# Patient Record
Sex: Female | Born: 1975 | Race: Black or African American | Hispanic: No | Marital: Single | Smoking: Current every day smoker
Health system: Southern US, Community
[De-identification: ages and names within clinical notes are randomized; demographics above are authoritative.]

## PROBLEM LIST (undated history)

## (undated) DIAGNOSIS — M1990 Unspecified osteoarthritis, unspecified site: Secondary | ICD-10-CM

## (undated) DIAGNOSIS — D649 Anemia, unspecified: Secondary | ICD-10-CM

## (undated) DIAGNOSIS — I10 Essential (primary) hypertension: Secondary | ICD-10-CM

## (undated) DIAGNOSIS — O009 Unspecified ectopic pregnancy without intrauterine pregnancy: Secondary | ICD-10-CM

## (undated) HISTORY — PX: ECTOPIC PREGNANCY SURGERY: SHX613

## (undated) HISTORY — PX: APPENDECTOMY: SHX54

## (undated) HISTORY — PX: WRIST FRACTURE SURGERY: SHX121

---

## 2019-03-15 ENCOUNTER — Encounter (HOSPITAL_BASED_OUTPATIENT_CLINIC_OR_DEPARTMENT_OTHER): Payer: Self-pay | Admitting: Emergency Medicine

## 2019-03-15 ENCOUNTER — Emergency Department (HOSPITAL_BASED_OUTPATIENT_CLINIC_OR_DEPARTMENT_OTHER)
Admission: EM | Admit: 2019-03-15 | Discharge: 2019-03-15 | Disposition: A | Discharge status: Home / Self Care | Payer: Medicaid Other | Attending: Emergency Medicine | Admitting: Emergency Medicine

## 2019-03-15 ENCOUNTER — Emergency Department (HOSPITAL_BASED_OUTPATIENT_CLINIC_OR_DEPARTMENT_OTHER): Payer: Medicaid Other

## 2019-03-15 ENCOUNTER — Other Ambulatory Visit: Payer: Self-pay

## 2019-03-15 DIAGNOSIS — I10 Essential (primary) hypertension: Secondary | ICD-10-CM | POA: Diagnosis not present

## 2019-03-15 DIAGNOSIS — F17210 Nicotine dependence, cigarettes, uncomplicated: Secondary | ICD-10-CM | POA: Diagnosis not present

## 2019-03-15 DIAGNOSIS — M25562 Pain in left knee: Secondary | ICD-10-CM | POA: Diagnosis not present

## 2019-03-15 DIAGNOSIS — M25561 Pain in right knee: Secondary | ICD-10-CM | POA: Insufficient documentation

## 2019-03-15 DIAGNOSIS — W19XXXA Unspecified fall, initial encounter: Secondary | ICD-10-CM

## 2019-03-15 HISTORY — DX: Unspecified ectopic pregnancy without intrauterine pregnancy: O00.90

## 2019-03-15 HISTORY — DX: Essential (primary) hypertension: I10

## 2019-03-15 MED ORDER — CYCLOBENZAPRINE HCL 10 MG PO TABS: 10.0000 mg | ORAL_TABLET | Freq: Two times a day (BID) | ORAL | 0 refills | Status: AC | PRN

## 2019-03-15 MED ORDER — HYDROCODONE-ACETAMINOPHEN 5-325 MG PO TABS
1.0000 | ORAL_TABLET | Freq: Once | ORAL | Status: AC
  Administered 2019-03-15: 1 via ORAL
  Filled 2019-03-15: qty 1

## 2019-03-15 MED ORDER — LISINOPRIL 10 MG PO TABS
20.0000 mg | ORAL_TABLET | Freq: Once | ORAL | Status: AC
  Administered 2019-03-15: 20 mg via ORAL
  Filled 2019-03-15: qty 2

## 2019-03-15 MED ORDER — NAPROXEN 500 MG PO TABS: 500.0000 mg | ORAL_TABLET | Freq: Two times a day (BID) | ORAL | 0 refills | Status: DC

## 2019-03-15 NOTE — Discharge Instructions (Signed)
Thank you for allowing me to care for you today. Please return to the emergency department if you have new or worsening symptoms. Take your medications as instructed.  ° °

## 2019-03-15 NOTE — ED Provider Notes (Signed)
[facility] EMERGENCY DEPARTMENT Provider Note   CSN: [id number] Arrival date & time: 03/15/19  1246     History Chief Complaint  Patient presents with  . Fall  . Knee Pain  . Hypertension    Monica Blanchard is a 43 y.o. female.  43 year old female with past medical history of hypertension presenting to the emergency department for bilateral knee pain after fall yesterday.  Patient reports that she was on her wooden deck last night when she had a mechanical fall and slipped and landed on her bilateral knees.  Did not hit her head or pass out.  Reports that she has been able to ambulate but with pain mostly in the medial aspect of her bilateral knees.  No previous injury to her knees in the past.  Has been trying ice, over-the-counter analgesics without relief.  Also, she reports that she has not taken any of her blood pressure medication today        Past Medical History:  Diagnosis Date  . Ectopic pregnancy    x 2  . Hypertension     There are no problems to display for this patient.   Past Surgical History:  Procedure Laterality Date  . APPENDECTOMY       OB History   No obstetric history on file.     History reviewed. No pertinent family history.  Social History   Tobacco Use  . Smoking status: Current Every Day Smoker    Packs/day: 0.50    Years: 15.00    Pack years: 7.50    Types: Cigarettes  . Smokeless tobacco: Never Used  Substance Use Topics  . Alcohol use: Yes    Comment: occ  . Drug use: Not on file    Home Medications Prior to Admission medications   Medication Sig Start Date End Date Taking? Authorizing Provider  cyclobenzaprine (FLEXERIL) 10 MG tablet Take 1 tablet (10 mg total) by mouth 2 (two) times daily as needed for up to 7 days for muscle spasms. 03/15/19 03/22/19  Ronnie Doss A, PA-C  naproxen (NAPROSYN) 500 MG tablet Take 1 tablet (500 mg total) by mouth 2 (two) times daily. 03/15/19   Arlyn Dunning, PA-C     Allergies    Patient has no allergy information on record.  Review of Systems   Review of Systems  Constitutional: Negative for chills and fever.  Respiratory: Negative for shortness of breath.   Cardiovascular: Negative for chest pain.  Musculoskeletal: Positive for arthralgias, gait problem and joint swelling. Negative for back pain, myalgias, neck pain and neck stiffness.  Skin: Negative for color change, rash and wound.  Neurological: Negative for dizziness, weakness, light-headedness and numbness.  Hematological: Does not bruise/bleed easily.  All other systems reviewed and are negative.   Physical Exam Updated Vital Signs BP (!) 194/107   Pulse 70   Temp 98.7 F (37.1 C) (Oral)   Resp 17   SpO2 100%   Physical Exam Vitals and nursing note reviewed.  Constitutional:      Appearance: Normal appearance.  HENT:     Head: Normocephalic.  Eyes:     Conjunctiva/sclera: Conjunctivae normal.  Cardiovascular:     Rate and Rhythm: Normal rate and regular rhythm.  Pulmonary:     Effort: Pulmonary effort is normal.  Musculoskeletal:     Right knee: Swelling present. No deformity, effusion, erythema, ecchymosis, lacerations, bony tenderness or crepitus. Normal range of motion. Tenderness present over the medial joint line. No patellar  tendon tenderness. Normal alignment.     Left knee: Swelling present. No deformity, effusion, erythema, ecchymosis, lacerations, bony tenderness or crepitus. Decreased range of motion. Tenderness present over the medial joint line. No patellar tendon tenderness. Normal alignment.     Right foot: Normal pulse.     Left foot: Normal pulse.     Comments: Bilateral medial knee swelling and tenderness,. Unable to test stability due to pain and swelling  Skin:    General: Skin is dry.  Neurological:     Mental Status: She is alert.  Psychiatric:        Mood and Affect: Mood normal.     ED Results / Procedures / Treatments   Labs (all labs  ordered are listed, but only abnormal results are displayed) Labs Reviewed - No data to display  EKG None  Radiology No results found.  Procedures Procedures (including critical care time)  Medications Ordered in ED Medications  lisinopril (ZESTRIL) tablet 20 mg (20 mg Oral Given 03/15/19 1441)  HYDROcodone-acetaminophen (NORCO/VICODIN) 5-325 MG per tablet 1 tablet (1 tablet Oral Given 03/15/19 1441)    ED Course  I have reviewed the triage vital signs and the nursing notes.  Pertinent labs & imaging results that were available during my care of the patient were reviewed by me and considered in my medical decision making (see chart for details).    MDM Rules/Calculators/A&P                      Based on review of vitals, medical screening exam, lab work and/or imaging, there does not appear to be an acute, emergent etiology for the patient's symptoms. Counseled pt on good return precautions and encouraged both PCP and ED follow-up as needed.  Prior to discharge, I also discussed incidental imaging findings with patient in detail and advised appropriate, recommended follow-up in detail.  Clinical Impression: 1. Fall, initial encounter   2. Acute pain of both knees     Disposition: Discharge  Prior to providing a prescription for a controlled substance, I independently reviewed the patient's recent prescription history on the [place]. The patient had no recent or regular prescriptions and was deemed appropriate for a brief, less than 3 day prescription of narcotic for acute analgesia.  This note was prepared with assistance of Systems analyst. Occasional wrong-word or sound-a-like substitutions may have occurred due to the inherent limitations of voice recognition software.  Final Clinical Impression(s) / ED Diagnoses Final diagnoses:  Fall, initial encounter  Acute pain of both knees    Rx / DC Orders ED  Discharge Orders         Ordered    cyclobenzaprine (FLEXERIL) 10 MG tablet  2 times daily PRN     03/15/19 1551    naproxen (NAPROSYN) 500 MG tablet  2 times daily     03/15/19 1551           Kristine Royal 03/18/19 2319    Margette Fast, MD 03/20/19 [phone]

## 2019-03-15 NOTE — ED Triage Notes (Signed)
Pt here after falling on deck yesterday. Bilateral knee pain. Pt says she hasn't taken blood pressure in 2 days and ate high sodium before coming in an feels like BP high.

## 2019-06-29 ENCOUNTER — Encounter (HOSPITAL_BASED_OUTPATIENT_CLINIC_OR_DEPARTMENT_OTHER): Payer: Self-pay | Admitting: *Deleted

## 2019-06-29 ENCOUNTER — Emergency Department (HOSPITAL_BASED_OUTPATIENT_CLINIC_OR_DEPARTMENT_OTHER)
Admission: EM | Admit: 2019-06-29 | Discharge: 2019-06-29 | Disposition: A | Discharge status: Home / Self Care | Payer: Medicaid Other | Attending: Emergency Medicine | Admitting: Emergency Medicine

## 2019-06-29 ENCOUNTER — Other Ambulatory Visit: Payer: Self-pay

## 2019-06-29 DIAGNOSIS — F17210 Nicotine dependence, cigarettes, uncomplicated: Secondary | ICD-10-CM | POA: Insufficient documentation

## 2019-06-29 DIAGNOSIS — I10 Essential (primary) hypertension: Secondary | ICD-10-CM | POA: Insufficient documentation

## 2019-06-29 DIAGNOSIS — M25552 Pain in left hip: Secondary | ICD-10-CM | POA: Diagnosis present

## 2019-06-29 MED ORDER — HYDROCHLOROTHIAZIDE 25 MG PO TABS
25.0000 mg | ORAL_TABLET | Freq: Once | ORAL | Status: AC
  Administered 2019-06-29: 16:00:00 25 mg via ORAL
  Filled 2019-06-29: qty 1

## 2019-06-29 MED ORDER — LISINOPRIL 10 MG PO TABS
10.0000 mg | ORAL_TABLET | Freq: Once | ORAL | Status: AC
  Administered 2019-06-29: 10 mg via ORAL
  Filled 2019-06-29: qty 1

## 2019-06-29 NOTE — Discharge Instructions (Signed)
Your blood pressure is high today.  Please take your blood pressure medications as prescribed.  Call and follow up closely with your doctor for recheck.  Take ibuprofen or tylenol as needed for recurrent hip pain.

## 2019-06-29 NOTE — ED Provider Notes (Signed)
[facility] EMERGENCY DEPARTMENT Provider Note   CSN: [id number] Arrival date & time: 06/29/19  1527     History No chief complaint on file.   Monica Blanchard is a 44 y.o. female.  The history is provided by the patient. No language interpreter was used.  Hip Pain     44 year old female with history of hypertension presenting complaining of high blood pressure.  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  She missed her Covid testing for the week and decided to go to an urgent care center to have her Covid test.  During her initial exam patient was found to have elevated blood pressure and was recommended to go to the ER for evaluation.  Patient states she takes 3 different blood pressures medications however she admits she has been "slacking off" and having been taking her blood pressure medication for approximately 4 days.  She still has it available but just did not take it.  She asked could her recurrent left hip pain be a source of her high blood pressure.  She states she has had recurrent pain to her left hip radiates towards her left thigh for nearly 2 months.  Pain is described as an sharp achy sensation, worse with movement which she attributed to arthritis pain.  She has been taking anti-inflammatory medication with improvement.  She rates the pain currently as 6 out of 10.  She does not complain of any fever chills no headache, vision changes, chest pain, trouble breathing, focal numbness or focal weakness.  She denies any bowel bladder incontinence or saddle anesthesia.  She denies any recent injury of her back.  She admits that she is dealing with a lot of stress at home and have not been taking care of herself appropriately.  She does not have any Covid symptoms.  Past Medical History:  Diagnosis Date  . Ectopic pregnancy    x 2  . Hypertension     There are no problems to display for this patient.   Past Surgical  History:  Procedure Laterality Date  . APPENDECTOMY       OB History   No obstetric history on file.     No family history on file.  Social History   Tobacco Use  . Smoking status: Current Every Day Smoker    Packs/day: 0.50    Years: 15.00    Pack years: 7.50    Types: Cigarettes  . Smokeless tobacco: Never Used  Substance Use Topics  . Alcohol use: Yes    Comment: occ  . Drug use: Not on file    Home Medications Prior to Admission medications   Medication Sig Start Date End Date Taking? Authorizing Provider  naproxen (NAPROSYN) 500 MG tablet Take 1 tablet (500 mg total) by mouth 2 (two) times daily. 03/15/19   Arlyn Dunning, PA-C    Allergies    Patient has no allergy information on record.  Review of Systems   Review of Systems  All other systems reviewed and are negative.   Physical Exam Updated Vital Signs There were no vitals taken for this visit.  Physical Exam Vitals and nursing note reviewed.  Constitutional:      General: She is not in acute distress.    Appearance: She is well-developed. She is obese.     Comments: Patient resting comfortably in bed in no acute discomfort.  HENT:     Head: Atraumatic.  Eyes:     Conjunctiva/sclera: Conjunctivae normal.  Cardiovascular:     Rate and Rhythm: Normal rate and regular rhythm.     Pulses: Normal pulses.     Heart sounds: Normal heart sounds.  Pulmonary:     Effort: Pulmonary effort is normal.     Breath sounds: Normal breath sounds.  Musculoskeletal:        General: Tenderness (Left hip: Tenderness to left inguinal region without overlying skin changes.  Normal hip flexion extension abduction abduction.  No significant lumbar spine tenderness.) present.     Cervical back: Neck supple.  Skin:    Capillary Refill: Capillary refill takes less than 2 seconds.     Findings: No rash.  Neurological:     Mental Status: She is alert.     ED Results / Procedures / Treatments   Labs (all labs  ordered are listed, but only abnormal results are displayed) Labs Reviewed - No data to display  EKG None  Radiology No results found.  Procedures Procedures (including critical care time)  Medications Ordered in ED Medications  hydrochlorothiazide (HYDRODIURIL) tablet 25 mg (25 mg Oral Given 06/29/19 1601)  lisinopril (ZESTRIL) tablet 10 mg (10 mg Oral Given 06/29/19 1601)    ED Course  I have reviewed the triage vital signs and the nursing notes.  Pertinent labs & imaging results that were available during my care of the patient were reviewed by me and considered in my medical decision making (see chart for details).    MDM Rules/Calculators/A&P                      BP (!) 196/123 (BP Location: Right Arm)   Pulse 65   Temp 98.2 F (36.8 C) (Oral)   Resp 16   Ht 5\' 9"  (1.753 m)   Wt 106.6 kg   SpO2 100%   BMI 34.70 kg/m   Final Clinical Impression(s) / ED Diagnoses Final diagnoses:  Asymptomatic hypertension  Left hip pain    Rx / DC Orders ED Discharge Orders    None     3:58 PM Patient here with initial concern of high blood pressure when it was incidentally noted during an visit to urgent care as patient requesting for a Covid test.  She does have elevated blood pressure in the 448J systolic in both arms.  However this is asymptomatic hypertension as patient does not presents with any concerning feature to suggest hypertensive urgency or emergency.  She has not taken her typical 3 blood pressure medications which is likely contributing to her elevated blood pressure.  She has tenderness to her left hip without any significant injury.  I did offer x-ray however it is likely low yield as we both agree the likelihood of fracture or dislocation is very low.  I will provide patient with home blood pressure medications.  I have low suspicion for dissection causing hip pain as her hip pain has been chronic for the past 2 months.  No focal neuro deficit on exam.  4:48  PM BP iimproves with BP medications.  Encourage pt to take her meds as previously prescribed. outpt f/u with PCP encourage.  Return precaution given.    Domenic Moras, PA-C 06/29/19 1650    Lucrezia Starch, MD 07/04/19 [phone]

## 2019-06-29 NOTE — ED Triage Notes (Signed)
Pt states she has a hx of HTN, today presents with left hip, pain site starts a left groin area and radiates down left leg, states when attempting to lift left leg

## 2019-08-25 ENCOUNTER — Emergency Department (HOSPITAL_BASED_OUTPATIENT_CLINIC_OR_DEPARTMENT_OTHER)
Admission: EM | Admit: 2019-08-25 | Discharge: 2019-08-25 | Disposition: A | Discharge status: Home / Self Care | Payer: Medicaid Other | Attending: Emergency Medicine | Admitting: Emergency Medicine

## 2019-08-25 ENCOUNTER — Emergency Department (HOSPITAL_BASED_OUTPATIENT_CLINIC_OR_DEPARTMENT_OTHER): Payer: Medicaid Other

## 2019-08-25 ENCOUNTER — Other Ambulatory Visit: Payer: Self-pay

## 2019-08-25 ENCOUNTER — Encounter (HOSPITAL_BASED_OUTPATIENT_CLINIC_OR_DEPARTMENT_OTHER): Payer: Self-pay | Admitting: *Deleted

## 2019-08-25 DIAGNOSIS — M87052 Idiopathic aseptic necrosis of left femur: Secondary | ICD-10-CM | POA: Diagnosis not present

## 2019-08-25 DIAGNOSIS — I10 Essential (primary) hypertension: Secondary | ICD-10-CM | POA: Diagnosis not present

## 2019-08-25 DIAGNOSIS — F17210 Nicotine dependence, cigarettes, uncomplicated: Secondary | ICD-10-CM | POA: Diagnosis not present

## 2019-08-25 DIAGNOSIS — M25552 Pain in left hip: Secondary | ICD-10-CM | POA: Diagnosis present

## 2019-08-25 DIAGNOSIS — Z79899 Other long term (current) drug therapy: Secondary | ICD-10-CM | POA: Diagnosis not present

## 2019-08-25 MED ORDER — KETOROLAC TROMETHAMINE 60 MG/2ML IM SOLN
60.0000 mg | Freq: Once | INTRAMUSCULAR | Status: AC
  Administered 2019-08-25: 60 mg via INTRAMUSCULAR
  Filled 2019-08-25: qty 2

## 2019-08-25 NOTE — ED Notes (Signed)
Provider aware of elevated bp.  Patient reports that she will take her meds when she get home.  Medicated for pain.  Left at this time.

## 2019-08-25 NOTE — ED Provider Notes (Signed)
[facility] EMERGENCY DEPARTMENT Provider Note   CSN: [id number] Arrival date & time: 08/25/19  1119     History Chief Complaint  Patient presents with  . Hip Pain    Monica Blanchard is a 44 y.o. female.  The history is provided by the patient and medical records. No language interpreter was used.  Hip Pain   Monica Blanchard is a 44 y.o. female who presents to the Emergency Department complaining of hip pain. She presents the emergency department complaining of atraumatic left hip pain for the last few months. Pain is located in the left lateral hip and in her groin. She describes it as a bone type pain. It is constant in nature but worse when she sleeps and when she moves. Over the last few weeks it has been significantly worse and now is waking her at night. She has been taking ibuprofen at home with initial improvement in her pain but now her pain persist despite ibuprofen use. No recent injuries. She denies any associated fevers, shortness of breath, abdominal pain, nausea, vomiting, numbness, weakness. She has a history of hypertension, no additional medical problems.    Past Medical History:  Diagnosis Date  . Ectopic pregnancy    x 2  . Hypertension     There are no problems to display for this patient.   Past Surgical History:  Procedure Laterality Date  . APPENDECTOMY       OB History   No obstetric history on file.     No family history on file.  Social History   Tobacco Use  . Smoking status: Current Every Day Smoker    Packs/day: 0.50    Years: 15.00    Pack years: 7.50    Types: Cigarettes  . Smokeless tobacco: Never Used  Substance Use Topics  . Alcohol use: Yes    Comment: occ  . Drug use: Not on file    Home Medications Prior to Admission medications   Medication Sig Start Date End Date Taking? Authorizing Provider  benazepril-hydrochlorthiazide (LOTENSIN HCT) 20-25 MG tablet Take 1 tablet by mouth daily.   Yes [provider]  lisinopril (ZESTRIL) 20 MG tablet Take 20 mg by mouth daily.   Yes [provider]  naproxen (NAPROSYN) 500 MG tablet Take 1 tablet (500 mg total) by mouth 2 (two) times daily. 03/15/19   Arlyn Dunning, PA-C    Allergies    Patient has no known allergies.  Review of Systems   Review of Systems  All other systems reviewed and are negative.   Physical Exam Updated Vital Signs BP (!) 150/107   Pulse 73   Temp 98.3 F (36.8 C) (Oral)   Resp 16   Ht 5\' 9"  (1.753 m)   Wt 106.6 kg   SpO2 98%   BMI 34.71 kg/m   Physical Exam Vitals and nursing note reviewed.  Constitutional:      Appearance: She is well-developed.  HENT:     Head: Normocephalic and atraumatic.  Cardiovascular:     Rate and Rhythm: Normal rate and regular rhythm.  Pulmonary:     Effort: Pulmonary effort is normal. No respiratory distress.  Abdominal:     Tenderness: There is no rebound.  Musculoskeletal:     Comments: 2+ DP pulses bilaterally. There is no tenderness to palpation over the hip on examination. There is pain with range of motion of the hip. Five out of five strength to bilateral lower extremities.  Skin:  General: Skin is warm and dry.  Neurological:     Mental Status: She is alert and oriented to person, place, and time.  Psychiatric:        Behavior: Behavior normal.     ED Results / Procedures / Treatments   Labs (all labs ordered are listed, but only abnormal results are displayed) Labs Reviewed - No data to display  EKG None  Radiology DG Hip Unilat W or Wo Pelvis 2-3 Views Left  Result Date: 08/25/2019 CLINICAL DATA:  Left hip pain for several months without known injury. EXAM: DG HIP (WITH OR WITHOUT PELVIS) 2-3V LEFT COMPARISON:  None. FINDINGS: There is no evidence of hip fracture or dislocation. However, there is a mixture of lucency and sclerosis involving the left femoral head concerning for avascular necrosis. 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 Electronically Signed   By: Marijo Conception M.D.   On: 08/25/2019 12:23    Procedures Procedures (including critical care time)  Medications Ordered in ED Medications - No data to display  ED Course  I have reviewed the triage vital signs and the nursing notes.  Pertinent labs & imaging results that were available during my care of the patient were reviewed by me and considered in my medical decision making (see chart for details).    MDM Rules/Calculators/A&P                     Patient here for evaluation of atraumatic left hip pain. She is neurologically and vascular early intact on evaluation. Imaging is concerning for a vascular necrosis of the left hip. Discussed with patient findings of studies recommendation for orthopedics follow-up. Recommend continuing home Tylenol and ibuprofen according to label instructions.  Presentation is not consistent with septic or gouty arthritis, dissection.  Final Clinical Impression(s) / ED Diagnoses Final diagnoses:  Avascular necrosis of bone of left hip [facility])    Rx / [place] Orders ED Discharge Orders    None       Quintella Reichert, MD 08/25/19 1338

## 2019-08-25 NOTE — ED Triage Notes (Signed)
Left hip pain for a few months. She has been taking OTC meds with no improvement.

## 2019-08-29 ENCOUNTER — Encounter (HOSPITAL_BASED_OUTPATIENT_CLINIC_OR_DEPARTMENT_OTHER): Payer: Self-pay | Admitting: *Deleted

## 2019-08-29 ENCOUNTER — Other Ambulatory Visit: Payer: Self-pay

## 2019-08-29 ENCOUNTER — Emergency Department (HOSPITAL_BASED_OUTPATIENT_CLINIC_OR_DEPARTMENT_OTHER)
Admission: EM | Admit: 2019-08-29 | Discharge: 2019-08-29 | Disposition: A | Discharge status: Home / Self Care | Payer: Medicaid Other | Attending: Emergency Medicine | Admitting: Emergency Medicine

## 2019-08-29 DIAGNOSIS — Z79899 Other long term (current) drug therapy: Secondary | ICD-10-CM | POA: Insufficient documentation

## 2019-08-29 DIAGNOSIS — I10 Essential (primary) hypertension: Secondary | ICD-10-CM | POA: Insufficient documentation

## 2019-08-29 DIAGNOSIS — M25552 Pain in left hip: Secondary | ICD-10-CM | POA: Diagnosis not present

## 2019-08-29 DIAGNOSIS — F17210 Nicotine dependence, cigarettes, uncomplicated: Secondary | ICD-10-CM | POA: Diagnosis not present

## 2019-08-29 MED ORDER — OXYCODONE-ACETAMINOPHEN 5-325 MG PO TABS
2.0000 | ORAL_TABLET | Freq: Once | ORAL | Status: AC
  Administered 2019-08-29: 2 via ORAL
  Filled 2019-08-29: qty 2

## 2019-08-29 MED ORDER — KETOROLAC TROMETHAMINE 60 MG/2ML IM SOLN
30.0000 mg | Freq: Once | INTRAMUSCULAR | Status: AC
  Administered 2019-08-29: 30 mg via INTRAMUSCULAR
  Filled 2019-08-29: qty 2

## 2019-08-29 MED ORDER — OXYCODONE-ACETAMINOPHEN 5-325 MG PO TABS: 1.0000 | ORAL_TABLET | Freq: Four times a day (QID) | ORAL | 0 refills | Status: DC | PRN

## 2019-08-29 NOTE — ED Notes (Signed)
ED Provider at bedside. 

## 2019-08-29 NOTE — ED Triage Notes (Signed)
Left hip pain. Her knee and her hip are swollen. She was seen for same 4 days ago.

## 2019-08-30 ENCOUNTER — Ambulatory Visit (HOSPITAL_BASED_OUTPATIENT_CLINIC_OR_DEPARTMENT_OTHER): Payer: Medicaid Other

## 2019-08-30 NOTE — ED Provider Notes (Signed)
[facility] EMERGENCY DEPARTMENT Provider Note   CSN: [id number] Arrival date & time: 08/29/19  2132     History Chief Complaint  Patient presents with  . Hip Pain    Monica Blanchard is a 44 y.o. female.   Hip Pain This is a new problem. The current episode started 3 to 5 hours ago. The problem occurs constantly. The problem has been gradually worsening. Pertinent negatives include no chest pain, no abdominal pain, no headaches and no shortness of breath. Nothing aggravates the symptoms. Nothing relieves the symptoms. She has tried nothing for the symptoms.       Past Medical History:  Diagnosis Date  . Ectopic pregnancy    x 2  . Hypertension     There are no problems to display for this patient.   Past Surgical History:  Procedure Laterality Date  . APPENDECTOMY       OB History   No obstetric history on file.     No family history on file.  Social History   Tobacco Use  . Smoking status: Current Every Day Smoker    Packs/day: 0.50    Years: 15.00    Pack years: 7.50    Types: Cigarettes  . Smokeless tobacco: Never Used  Substance Use Topics  . Alcohol use: Yes    Comment: occ  . Drug use: Not on file    Home Medications Prior to Admission medications   Medication Sig Start Date End Date Taking? Authorizing Provider  benazepril-hydrochlorthiazide (LOTENSIN HCT) 20-25 MG tablet Take 1 tablet by mouth daily.   Yes [provider]  lisinopril (ZESTRIL) 20 MG tablet Take 20 mg by mouth daily.   Yes [provider]  naproxen (NAPROSYN) 500 MG tablet Take 1 tablet (500 mg total) by mouth 2 (two) times daily. 03/15/19   Arlyn Dunning, PA-C  oxyCODONE-acetaminophen (PERCOCET) 5-325 MG tablet Take 1 tablet by mouth every 6 (six) hours as needed for severe pain. 08/29/19   Pricilla Moehle, Barbara Cower, MD    Allergies    Patient has no known allergies.  Review of Systems   Review of Systems  Respiratory: Negative for shortness of breath.     Cardiovascular: Negative for chest pain.  Gastrointestinal: Negative for abdominal pain.  Neurological: Negative for headaches.  All other systems reviewed and are negative.   Physical Exam Updated Vital Signs BP (!) 196/116   Pulse 81   Temp 98.9 F (37.2 C) (Oral)   Resp 18   Ht 5\' 9"  (1.753 m)   Wt 106.6 kg   SpO2 100%   BMI 34.71 kg/m   Physical Exam Vitals and nursing note reviewed.  Constitutional:      Appearance: She is well-developed.  HENT:     Head: Normocephalic and atraumatic.     Mouth/Throat:     Mouth: Mucous membranes are dry.     Pharynx: Oropharynx is clear.  Eyes:     Conjunctiva/sclera: Conjunctivae normal.  Cardiovascular:     Rate and Rhythm: Normal rate and regular rhythm.  Pulmonary:     Effort: No respiratory distress.     Breath sounds: No stridor.  Abdominal:     General: There is no distension.  Musculoskeletal:        General: Swelling (left thigh area) and tenderness (with weight bearing of left hip) present. Normal range of motion.     Cervical back: Normal range of motion.  Skin:    General: Skin is warm and  dry.  Neurological:     General: No focal deficit present.     Mental Status: She is alert.     ED Results / Procedures / Treatments   Labs (all labs ordered are listed, but only abnormal results are displayed) Labs Reviewed - No data to display  EKG None  Radiology No results found.  Procedures Procedures (including critical care time)  Medications Ordered in ED Medications  ketorolac (TORADOL) injection 30 mg (30 mg Intramuscular Given 08/29/19 2323)  oxyCODONE-acetaminophen (PERCOCET/ROXICET) 5-325 MG per tablet 2 tablet (2 tablets Oral Given 08/29/19 2323)    ED Course  I have reviewed the triage vital signs and the nursing notes.  Pertinent labs & imaging results that were available during my care of the patient were reviewed by me and considered in my medical decision making (see chart for details).     MDM Rules/Calculators/A&P                      Recently dx with AVN. Hasn't seen anyone, appt on Monday. Now with some left upper leg swelling. Will return tomorrow for dvt study. Increased pain medications for now.   Final Clinical Impression(s) / ED Diagnoses Final diagnoses:  Acute pain of left hip  Hypertension, unspecified type    Rx / DC Orders ED Discharge Orders         Ordered    US Venous Img Lower Unilateral Left     08/29/19 2313    oxyCODONE-acetaminophen (PERCOCET) 5-325 MG tablet  Every 6 hours PRN     08/29/19 2323           Belmira Daley, Corene Cornea, MD 08/30/19 [phone]

## 2019-09-04 ENCOUNTER — Other Ambulatory Visit: Payer: Self-pay

## 2019-09-04 ENCOUNTER — Encounter: Payer: Self-pay | Admitting: Orthopedic Surgery

## 2019-09-04 ENCOUNTER — Ambulatory Visit (INDEPENDENT_AMBULATORY_CARE_PROVIDER_SITE_OTHER): Payer: Medicaid Other | Admitting: Orthopedic Surgery

## 2019-09-04 ENCOUNTER — Ambulatory Visit: Payer: Medicaid Other | Admitting: Orthopedic Surgery

## 2019-09-04 VITALS — Ht 69.0 in | Wt 235.0 lb

## 2019-09-04 DIAGNOSIS — M25552 Pain in left hip: Secondary | ICD-10-CM | POA: Diagnosis not present

## 2019-09-05 ENCOUNTER — Other Ambulatory Visit: Payer: Self-pay | Admitting: Surgical

## 2019-09-05 ENCOUNTER — Telehealth: Payer: Self-pay | Admitting: Orthopedic Surgery

## 2019-09-05 MED ORDER — TRAMADOL HCL 50 MG PO TABS: 50.0000 mg | ORAL_TABLET | Freq: Two times a day (BID) | ORAL | 0 refills | Status: DC | PRN

## 2019-09-05 MED ORDER — MELOXICAM 15 MG PO TABS: 15.0000 mg | ORAL_TABLET | Freq: Every day | ORAL | 0 refills | Status: AC

## 2019-09-05 NOTE — Telephone Encounter (Signed)
Submitted

## 2019-09-05 NOTE — Telephone Encounter (Signed)
Pt called stating her rx needed to be preauthorized before she could pick it up and she would like a call back once this has been done; pt would like this done soon as possible since she's been waiting since yesterday for it.  [phone]

## 2019-09-05 NOTE — Telephone Encounter (Signed)
IC patient advised.  

## 2019-09-05 NOTE — Telephone Encounter (Signed)
Patient called.   She her tramadol and anti-inflammatory was never called in to her pharmacy after her appointment yesterday.   Call back: [phone]

## 2019-09-05 NOTE — Telephone Encounter (Signed)
Can you please submit this? Thanks.

## 2019-09-06 ENCOUNTER — Telehealth: Payer: Self-pay | Admitting: Orthopedic Surgery

## 2019-09-06 NOTE — Telephone Encounter (Signed)
I called patient advised that this had been submitted on [place] [employer]. Waiting for auth

## 2019-09-06 NOTE — Telephone Encounter (Signed)
Pt called stating this is the second day without her medicine and she needs Dr. August Saucer to preapprove it in order for insurance to cover it.   [phone]

## 2019-09-06 NOTE — Telephone Encounter (Signed)
Authorized on [employer] Patient advised.

## 2019-09-09 ENCOUNTER — Encounter: Payer: Self-pay | Admitting: Orthopedic Surgery

## 2019-09-09 NOTE — Progress Notes (Signed)
Office Visit Note   Patient: Monica Blanchard           Date of Birth: 05-11-1975           MRN: [id number] Visit Date: 09/04/2019 Requested by: Malka So., MD No address on file PCP: Malka So., MD  Subjective: Chief Complaint  Patient presents with  . Left Hip - Pain    HPI: Monica Blanchard is a 44 y.o. female who presents to the office complaining of left hip pain.  Patient reports a fall last year and ever since then she has been experiencing worsening left groin pain.  Pain now wakes her up at night.  Pain travels down her anterior thigh and occasionally down into the anterior tibia.  She denies any history of sickle cell disease, alcohol use, steroid use.  She denies any low back pain but she does have buttock pain with numbness/tingling that travels down her leg at times.  She notes mechanical symptoms of her hips with clicking and grinding.  She has been taking Motrin, Tylenol, turmeric without any relief.  She has been seen by the ED on 08/25/2019 and had left hip radiographs that were suspicious for avascular necrosis of the left femoral head.  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                ROS:  All systems reviewed are negative as they relate to the chief complaint within the history of present illness.  Patient denies fevers or chills.  Assessment & Plan: Visit Diagnoses:  1. Hip pain, left     Plan: Patient is a 44 year old female presents complaining of left groin pain that has been steadily worsening since a fall last year.  Pain is now progressed to wake her up at night and is causing her significant discomfort that is uncontrollable with over-the-counter medication.  She has been seen in the ER late last month and is diagnosed with avascular necrosis on x-ray.  She has no history of sickle cell disease, alcoholism, steroid use.  She complains of popping in her hip and pain is  worse with manipulation of the hip joint on exam.  Ordered pelvic MRI to evaluate bilateral AVN.  She has no symptoms in her right hip at this time.  Patient is interested in pursuing 9 hip replacement options but based on her symptoms and radiographic appearance hip replacement may be her only option.  Provided work note to keep patient out.  Also prescribed tramadol and meloxicam for patient symptomatic relief.  Follow-Up Instructions: No follow-ups on file.   Orders:  Orders Placed This Encounter  Procedures  . MR Pelvis w/o contrast   No orders of the defined types were placed in this encounter.     Procedures: No procedures performed   Clinical Data: No additional findings.  Objective: Vital Signs: Ht 5\' 9"  (1.753 m)   Wt 235 lb (106.6 kg)   BMI 34.70 kg/m   Physical Exam:  Constitutional: Patient appears well-developed HEENT:  Head: Normocephalic Eyes:EOM are normal Neck: Normal range of motion Cardiovascular: Normal rate Pulmonary/chest: Effort normal Neurologic: Patient is alert Skin: Skin is warm Psychiatric: Patient has normal mood and affect  Ortho Exam:  No pain with terminal forward flexion or internal rotation of the right hip joint.  Significant pain with terminal forward flexion and internal rotation of the left hip joint  with reduced range of motion compared with the contralateral side.  Positive Stinchfield exam on the left negative on the right.  Negative straight leg raise bilaterally.  5/5 motor strength of the bilateral hip flexors, quadriceps, hamstring, dorsiflexion, plantarflexion.  Sensation intact through all dermatomes of the bilateral lower extremities.  No tenderness to palpation over the greater trochanter bilaterally  Specialty Comments:  No specialty comments available.  Imaging: No results found.   PMFS History: There are no problems to display for this patient.  Past Medical History:  Diagnosis Date  . Ectopic pregnancy    x 2    . Hypertension     No family history on file.  Past Surgical History:  Procedure Laterality Date  . APPENDECTOMY     Social History   Occupational History  . Not on file  Tobacco Use  . Smoking status: Current Every Day Smoker    Packs/day: 0.50    Years: 15.00    Pack years: 7.50    Types: Cigarettes  . Smokeless tobacco: Never Used  Substance and Sexual Activity  . Alcohol use: Yes    Comment: occ  . Drug use: Not on file  . Sexual activity: Not Currently

## 2019-09-10 ENCOUNTER — Encounter: Payer: Self-pay | Admitting: Orthopedic Surgery

## 2019-09-11 NOTE — Progress Notes (Signed)
Faxed to Dr Derrell Lolling at [phone]

## 2019-09-12 ENCOUNTER — Other Ambulatory Visit: Payer: Medicaid Other

## 2019-09-15 ENCOUNTER — Other Ambulatory Visit: Payer: Self-pay

## 2019-09-15 ENCOUNTER — Emergency Department (HOSPITAL_BASED_OUTPATIENT_CLINIC_OR_DEPARTMENT_OTHER)
Admission: EM | Admit: 2019-09-15 | Discharge: 2019-09-16 | Disposition: A | Discharge status: Home / Self Care | Payer: Medicaid Other | Attending: Emergency Medicine | Admitting: Emergency Medicine

## 2019-09-15 ENCOUNTER — Emergency Department (HOSPITAL_BASED_OUTPATIENT_CLINIC_OR_DEPARTMENT_OTHER): Payer: Medicaid Other

## 2019-09-15 ENCOUNTER — Encounter (HOSPITAL_BASED_OUTPATIENT_CLINIC_OR_DEPARTMENT_OTHER): Payer: Self-pay | Admitting: Emergency Medicine

## 2019-09-15 DIAGNOSIS — I10 Essential (primary) hypertension: Secondary | ICD-10-CM | POA: Insufficient documentation

## 2019-09-15 DIAGNOSIS — M87052 Idiopathic aseptic necrosis of left femur: Secondary | ICD-10-CM | POA: Diagnosis not present

## 2019-09-15 DIAGNOSIS — F17210 Nicotine dependence, cigarettes, uncomplicated: Secondary | ICD-10-CM | POA: Diagnosis not present

## 2019-09-15 DIAGNOSIS — M25552 Pain in left hip: Secondary | ICD-10-CM | POA: Diagnosis present

## 2019-09-15 MED ORDER — KETOROLAC TROMETHAMINE 60 MG/2ML IM SOLN
60.0000 mg | Freq: Once | INTRAMUSCULAR | Status: DC
  Filled 2019-09-15: qty 2

## 2019-09-15 MED ORDER — OXYCODONE-ACETAMINOPHEN 5-325 MG PO TABS: 1.0000 | ORAL_TABLET | ORAL | 0 refills | Status: DC | PRN

## 2019-09-15 MED ORDER — OXYCODONE-ACETAMINOPHEN 5-325 MG PO TABS
1.0000 | ORAL_TABLET | Freq: Once | ORAL | Status: AC
  Administered 2019-09-15: 1 via ORAL
  Filled 2019-09-15: qty 1

## 2019-09-15 NOTE — ED Provider Notes (Signed)
[facility] EMERGENCY DEPARTMENT Provider Note   CSN: [id number] Arrival date & time: 09/15/19  1946     History Chief Complaint  Patient presents with  . Hip Pain    Monica Blanchard is a 44 y.o. female.  The history is provided by the patient and medical records. No language interpreter was used.   Monica Blanchard is a 44 y.o. female who presents to the Emergency Department complaining of hip pain. Has a history of left hip pain secondary to avascular necrosis that is been seen by Dr. August Saucer with orthopedics. She was cleared to go back to work about three days ago. When she was at work tonight she felt like her hip was about to pop out of the socket and she experienced severe worsening of her pain. She has been taking tramadol and meloxicam at home with no significant improvement. Pain is worse with movement and walking. She denies any additional symptoms.    Past Medical History:  Diagnosis Date  . Ectopic pregnancy    x 2  . Hypertension     There are no problems to display for this patient.   Past Surgical History:  Procedure Laterality Date  . APPENDECTOMY    . CESAREAN SECTION    . ECTOPIC PREGNANCY SURGERY    . WRIST FRACTURE SURGERY       OB History   No obstetric history on file.     Family History  Problem Relation Age of Onset  . Diabetes Other   . Hypertension Other     Social History   Tobacco Use  . Smoking status: Current Every Day Smoker    Packs/day: 0.50    Years: 15.00    Pack years: 7.50    Types: Cigarettes  . Smokeless tobacco: Never Used  Vaping Use  . Vaping Use: Never used  Substance Use Topics  . Alcohol use: Yes    Comment: occ  . Drug use: Never    Home Medications Prior to Admission medications   Medication Sig Start Date End Date Taking? Authorizing Provider  benazepril-hydrochlorthiazide (LOTENSIN HCT) 20-25 MG tablet Take 1 tablet by mouth daily.    [provider]  lisinopril (ZESTRIL) 20 MG tablet  Take 20 mg by mouth daily.    [provider]  meloxicam (MOBIC) 15 MG tablet Take 1 tablet (15 mg total) by mouth daily. 09/05/19 09/04/20  Magnant, Charles L, PA-C  naproxen (NAPROSYN) 500 MG tablet Take 1 tablet (500 mg total) by mouth 2 (two) times daily. Patient not taking: Reported on 09/04/2019 03/15/19   Arlyn Dunning, PA-C  oxyCODONE-acetaminophen (PERCOCET/ROXICET) 5-325 MG tablet Take 1 tablet by mouth every 4 (four) hours as needed for severe pain. 09/15/19   Tilden Fossa, MD  traMADol (ULTRAM) 50 MG tablet Take 1 tablet (50 mg total) by mouth every 12 (twelve) hours as needed. 09/05/19 09/04/20  Magnant, Joycie Peek, PA-C    Allergies    Patient has no known allergies.  Review of Systems   Review of Systems  All other systems reviewed and are negative.   Physical Exam Updated Vital Signs BP (!) 168/98 (BP Location: Right Arm)   Pulse 74   Temp 98.6 F (37 C) (Oral)   Resp 16   Ht 5\' 9"  (1.753 m)   Wt 106.6 kg   SpO2 99%   BMI 34.70 kg/m   Physical Exam Vitals and nursing note reviewed.  Constitutional:      Appearance: She is  well-developed.  HENT:     Head: Normocephalic and atraumatic.  Cardiovascular:     Rate and Rhythm: Normal rate and regular rhythm.     Heart sounds: No murmur heard.   Pulmonary:     Effort: Pulmonary effort is normal. No respiratory distress.     Breath sounds: Normal breath sounds.  Abdominal:     Palpations: Abdomen is soft.     Tenderness: There is no abdominal tenderness. There is no guarding or rebound.  Musculoskeletal:        General: No tenderness.     Comments: 2+ DP pulses bilaterally.  There is tenderness to palpation over the left lateral hip.  Increased pain with ROM of the left hip.    Skin:    General: Skin is warm and dry.  Neurological:     Mental Status: She is alert and oriented to person, place, and time.  Psychiatric:        Behavior: Behavior normal.     ED Results / Procedures / Treatments    Labs (all labs ordered are listed, but only abnormal results are displayed) Labs Reviewed - No data to display  EKG None  Radiology CT Hip Left Wo Contrast  Addendum Date: 09/15/2019   ADDENDUM REPORT: 09/15/2019 23:02 ADDENDUM: Bones/Joint/Cartilage A mildly displaced fracture deformity is seen involving the left femoral head. IMPRESSION: 1. Mildly displaced fracture deformity involving the left femoral head. MRI correlation is recommended. Electronically Signed   By: Virgina Norfolk M.D.   On: 09/15/2019 23:02   Result Date: 09/15/2019 CLINICAL DATA:  Evaluation of chronic hip pain. EXAM: CT OF THE LEFT HIP WITHOUT CONTRAST TECHNIQUE: Multidetector CT imaging of the left hip was performed according to the standard protocol. Multiplanar CT image reconstructions were also generated. COMPARISON:  None. FINDINGS: Bones/Joint/Cartilage A mildly displaced fracture deformity is seen involving the left humeral head. Adjacent 1.0 cm 1.3 cm subcortical cysts are seen within this region. There is no evidence of dislocation. Mild to moderate severity degenerative changes are noted along the lateral aspect of the left acetabulum. Ligaments Suboptimally assessed by CT. Muscles and Tendons The muscles and tendons are intact. Soft tissues Soft tissue structures are normal in appearance without evidence of significant soft tissue swelling or hematoma. IMPRESSION: 1. Mildly displaced fracture deformity involving the left humeral head. MRI correlation is recommended. 2. Mild to moderate severity degenerative changes along the lateral aspect of the left acetabulum. Electronically Signed: By: Virgina Norfolk M.D. On: 09/15/2019 22:36   DG Hip Unilat W or Wo Pelvis 2-3 Views Left  Result Date: 09/15/2019 CLINICAL DATA:  Hip pain, felt a pop EXAM: DG HIP (WITH OR WITHOUT PELVIS) 2-3V LEFT COMPARISON:  CT 03/14/2015, radiograph 08/25/2019 FINDINGS: Serpentine sclerosis and increasing lucency of the left femoral head  are compatible with an osteochondral lesion including a possible osteochondral fracture fragment of the superior femoral head there is associated bony remodeling of the left acetabulum and mild thickening about the left hip joint which could reflect some effusion and or synovitis. These features are somewhat progressive from the comparison study. Mild-to-moderate degenerative changes are present in the right hip. Bones of the pelvis are otherwise intact and congruent. Phleboliths in the pelvis. Remaining soft tissues are unremarkable. IMPRESSION: 1. 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 Features are progressive from the comparison study. Progressive bony remodeling of the left acetabulum and soft  tissue thickening of the left hip, could suggest an effusion. Could consider further evaluation with MRI or CT. 2. Mild-to-moderate degenerative changes in the right hip. Electronically Signed   By: Kreg Shropshire M.D.   On: 09/15/2019 21:56    Procedures Procedures (including critical care time)  Medications Ordered in ED Medications  ketorolac (TORADOL) injection 60 mg (60 mg Intramuscular Refused 09/15/19 2137)  oxyCODONE-acetaminophen (PERCOCET/ROXICET) 5-325 MG per tablet 1 tablet (1 tablet Oral Given 09/15/19 2152)    ED Course  I have reviewed the triage vital signs and the nursing notes.  Pertinent labs & imaging results that were available during my care of the patient were reviewed by me and considered in my medical decision making (see chart for details).    MDM Rules/Calculators/A&P                          Pt with hx/o avascular necrosis of the left hip here with worsening left hip pain wit subsequent fracture of the femoral head. Discussed with Dr. Fayrene Fearing, with [facility] orthopedics. He recommends crutches with outpatient orthopedics follow-up. Discussed with patient  home care, outpatient follow-up and return precautions.  Final Clinical Impression(s) / ED Diagnoses Final diagnoses:  Avascular necrosis of bone of left hip (HCC)    Rx / DC Orders ED Discharge Orders         Ordered    oxyCODONE-acetaminophen (PERCOCET/ROXICET) 5-325 MG tablet  Every 4 hours PRN     Discontinue  Reprint     09/15/19 2351           Tilden Fossa, MD 09/16/19 [phone]

## 2019-09-15 NOTE — ED Notes (Signed)
Pt to BR in w/c . Able to bear weight. Pt calm, on stretcher texting on phone. No acute pain noted.

## 2019-09-15 NOTE — ED Triage Notes (Signed)
Pt is c/o left hip pain  Pt states she was diagnosed with avascular necrosis of her hip  Pt states she sees Dr August Saucer  Just went back to work this week  Pt states it hurts to bear weight and it feels like her hip is going to come out of joint

## 2019-09-18 ENCOUNTER — Encounter: Payer: Self-pay | Admitting: Orthopedic Surgery

## 2019-09-18 ENCOUNTER — Ambulatory Visit (INDEPENDENT_AMBULATORY_CARE_PROVIDER_SITE_OTHER): Payer: Medicaid Other | Admitting: Orthopedic Surgery

## 2019-09-18 DIAGNOSIS — S72052A Unspecified fracture of head of left femur, initial encounter for closed fracture: Secondary | ICD-10-CM

## 2019-09-18 DIAGNOSIS — M87052 Idiopathic aseptic necrosis of left femur: Secondary | ICD-10-CM | POA: Diagnosis not present

## 2019-09-19 ENCOUNTER — Other Ambulatory Visit: Payer: Self-pay

## 2019-09-19 ENCOUNTER — Ambulatory Visit (INDEPENDENT_AMBULATORY_CARE_PROVIDER_SITE_OTHER): Payer: Medicaid Other | Admitting: Orthopaedic Surgery

## 2019-09-19 VITALS — Ht 69.0 in | Wt 235.0 lb

## 2019-09-19 DIAGNOSIS — M87052 Idiopathic aseptic necrosis of left femur: Secondary | ICD-10-CM

## 2019-09-19 DIAGNOSIS — M87051 Idiopathic aseptic necrosis of right femur: Secondary | ICD-10-CM | POA: Insufficient documentation

## 2019-09-19 NOTE — Progress Notes (Addendum)
Office Visit Note   Patient: Monica Blanchard           Date of Birth: Jan 16, 1976           MRN: [id number] Visit Date: 09/19/2019              Requested by: Lilian Coma., MD No address on file PCP: Lilian Coma., MD   Assessment & Plan: Visit Diagnoses:  1. Avascular necrosis of bone of left hip (HCC)     Plan: The patient does have severe left hip pain from her avascular necrosis with femoral head collapse.  She is in need of hip replacement surgery sometime in the very near future.  She will continue on her crutches and be out of work until we get this scheduled.  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  I showed her hip model explained in detail what hip replacement surgery involves.  We had a long thorough discussion about the risk and benefits of this surgery as well as what her interoperative and postoperative course involved.  All questions and concerns were answered and addressed.  We will work on getting this scheduled soon.  Follow-Up Instructions: Return for 2 weeks post-op.   Orders:  No orders of the defined types were placed in this encounter.  No orders of the defined types were placed in this encounter.     Procedures: No procedures performed   Clinical Data: No additional findings.   Subjective: Chief Complaint  Patient presents with  . Left Hip - Follow-up, Pain  Patient is a very pleasant 44 year old female with known avascular necrosis of her left hip with now acute femoral head collapse.  She is ambulate with crutches.  She works as a Quarry manager.  This is gotten rapidly worse in less than a few months now.  She reports that she had a fall onto her knees last year and I did potentially jam her hip.  She denies any alcohol abuse or other surgeries or injections.  She is not been on steroids for a long period of time.  She has severe hip pain at this point.  It is 10 out of 10.  She has a CT scan  that correlates with avascular necrosis of the left hip with acute femoral head collapse.  She is referred to me from Dr. Marlou Sa to work on getting her on the schedule for hip replacement.  I discussed this with her in length.  She understands why she is here today.  HPI  Review of Systems She currently denies any headache, chest pain, shortness of breath, fever, chills, nausea, vomiting  Objective: Vital Signs: Ht 5\' 9"  (1.753 m)   Wt 235 lb (106.6 kg)   BMI 34.70 kg/m   Physical Exam She is alert and orient x3 and in no acute distress Ortho Exam Examination of her right hip is normal.  Examination of her left hip shows severe pain with attempts of internal and external rotation. Specialty Comments:  No specialty comments available.  Imaging: No results found. Independent review of the plain films and CT scan of her left hip shows cystic changes in the femoral head as well as collapse of the femoral head.  PMFS History: Patient Active Problem List   Diagnosis Date Noted  . Avascular necrosis of bone of left hip ([place]) 09/19/2019   Past Medical History:  Diagnosis Date  .  Ectopic pregnancy    x 2  . Hypertension     Family History  Problem Relation Age of Onset  . Diabetes Other   . Hypertension Other     Past Surgical History:  Procedure Laterality Date  . APPENDECTOMY    . CESAREAN SECTION    . ECTOPIC PREGNANCY SURGERY    . WRIST FRACTURE SURGERY     Social History   Occupational History  . Not on file  Tobacco Use  . Smoking status: Current Every Day Smoker    Packs/day: 0.50    Years: 15.00    Pack years: 7.50    Types: Cigarettes  . Smokeless tobacco: Never Used  Vaping Use  . Vaping Use: Never used  Substance and Sexual Activity  . Alcohol use: Yes    Comment: occ  . Drug use: Never  . Sexual activity: Not Currently

## 2019-09-21 ENCOUNTER — Other Ambulatory Visit: Payer: Self-pay | Admitting: Physician Assistant

## 2019-09-23 ENCOUNTER — Encounter: Payer: Self-pay | Admitting: Orthopedic Surgery

## 2019-09-23 NOTE — Progress Notes (Signed)
Office Visit Note   Patient: Monica Blanchard           Date of Birth: [birth year]           MRN: [id number] Visit Date: 09/18/2019 Requested by: Malka So., MD No address on file PCP: Malka So., MD  Subjective: Chief Complaint  Patient presents with  . Follow-up    HPI: Monica Blanchard is a 44 y.o. female who presents to the office complaining of left hip pain.  Patient has previously been seen in the office and been evaluated for avascular necrosis.  She has MRI scan of the pelvis pending.  She went to the ED over the weekend where a CT scan was obtained that revealed mildly displaced fracture deformity involving the left femoral head.  She is advised to be nonweightbearing.  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  She has been taking oxycodone that she received from the ER.              ROS:  All systems reviewed are negative as they relate to the chief complaint within the history of present illness.  Patient denies fevers or chills.  Assessment & Plan: Visit Diagnoses:  1. Closed fracture of head of left femur, initial encounter (HCC)   2. Avascular necrosis of hip, left (HCC)     Plan: Patient is a 44 year old female who presents complaining of worsening left hip pain.  She localizes pain to the groin.  She has previously been experiencing pain over the last year but pain is acutely worse since an incident where she was try to give a resident a shower and felt a pop in her hip.  She went to the ER and a CT scan obtained revealed fracture of the femoral head.  Radiologist was contacted and his impression is that she appears to have accommodation of avascular necrosis as well as femoral head fracture.  This patient was discussed with Dr. Magnus Ivan and will be referred to see either him or his physician assistant Bronson Curb later this week.  I will discuss total hip arthroplasty.  Provided work note for  patient stating she will be out of work over the next 2 to 3 months.  MRI scan of the pelvis was canceled.  Follow-up with Dr. Magnus Ivan.  Follow-Up Instructions: No follow-ups on file.   Orders:  No orders of the defined types were placed in this encounter.  No orders of the defined types were placed in this encounter.     Procedures: No procedures performed   Clinical Data: No additional findings.  Objective: Vital Signs: There were no vitals taken for this visit.  Physical Exam:  Constitutional: Patient appears well-developed HEENT:  Head: Normocephalic Eyes:EOM are normal Neck: Normal range of motion Cardiovascular: Normal rate Pulmonary/chest: Effort normal Neurologic: Patient is alert Skin: Skin is warm Psychiatric: Patient has normal mood and affect  Ortho Exam:  Severe pain with passive range of motion of the left hip joint.  Unable to bear weight on the left lower extremity.  Localizes pain to the groin.  Positive Stinchfield exam.  Specialty Comments:  No specialty comments available.  Imaging: No results found.   PMFS History: Patient Active Problem List   Diagnosis Date Noted  . Avascular necrosis of bone of left hip (HCC) 09/19/2019   Past Medical History:  Diagnosis Date  . Ectopic pregnancy  x 2  . Hypertension     Family History  Problem Relation Age of Onset  . Diabetes Other   . Hypertension Other     Past Surgical History:  Procedure Laterality Date  . APPENDECTOMY    . CESAREAN SECTION    . ECTOPIC PREGNANCY SURGERY    . WRIST FRACTURE SURGERY     Social History   Occupational History  . Not on file  Tobacco Use  . Smoking status: Current Every Day Smoker    Packs/day: 0.50    Years: 15.00    Pack years: 7.50    Types: Cigarettes  . Smokeless tobacco: Never Used  Vaping Use  . Vaping Use: Never used  Substance and Sexual Activity  . Alcohol use: Yes    Comment: occ  . Drug use: Never  . Sexual activity: Not  Currently

## 2019-09-26 NOTE — Patient Instructions (Addendum)
DUE TO COVID-19 ONLY ONE VISITOR IS ALLOWED TO COME WITH YOU AND STAY IN THE WAITING ROOM ONLY DURING PRE OP AND PROCEDURE DAY OF SURGERY. THE 1 VISITOR MAY VISIT WITH YOU AFTER SURGERY IN YOUR PRIVATE ROOM DURING VISITING HOURS ONLY!  YOU NEED TO HAVE A COVID 19 TEST ON: 10/03/19 @ 2:00 pm , THIS TEST MUST BE DONE BEFORE SURGERY, COME  [address], [place] , [phone].  [facility]) ONCE YOUR COVID TEST IS COMPLETED, PLEASE BEGIN THE QUARANTINE INSTRUCTIONS AS OUTLINED IN YOUR HANDOUT.                Monica Blanchard   Your procedure is scheduled on: 10/06/19   [facility]   Report to admitting at: 8:30 AM     Call this number if you have problems the morning of surgery [phone]    Remember:   NO SOLID FOOD AFTER MIDNIGHT THE NIGHT PRIOR TO SURGERY. NOTHING BY MOUTH EXCEPT CLEAR LIQUIDS UNTIL: 8:00 am . PLEASE FINISH ENSURE DRINK PER SURGEON ORDER  WHICH NEEDS TO BE COMPLETED AT: 8:00 am .   CLEAR LIQUID DIET   Foods Allowed                                                                     Foods Excluded  Coffee and tea, regular and decaf                             liquids that you cannot  Plain Jell-O any favor except red or purple                                           see through such as: Fruit ices (not with fruit pulp)                                     milk, soups, orange juice  Iced Popsicles                                    All solid food Carbonated beverages, regular and diet                                    Cranberry, grape and apple juices Sports drinks like Gatorade Lightly seasoned clear broth or consume(fat free) Sugar, honey syrup  Sample Menu Breakfast                                Lunch                                     Supper Cranberry juice  Beef broth                            Chicken broth Jell-O                                     Grape juice                            Apple juice Coffee or tea                        Jell-O                                      Popsicle                                                Coffee or tea                        Coffee or tea  _____________________________________________________________________  BRUSH YOUR TEETH MORNING OF SURGERY AND RINSE YOUR MOUTH OUT, NO CHEWING GUM CANDY OR MINTS.     Take these medicines the morning of surgery with A SIP OF WATER: amlodipine.                                You may not have any metal on your body including hair pins and              piercings  Do not wear jewelry, make-up, lotions, powders or perfumes, deodorant             Do not wear nail polish on your fingernails.  Do not shave  48 hours prior to surgery.             .   Do not bring valuables to the hospital. [facility] IS NOT             RESPONSIBLE   FOR VALUABLES.  Contacts, dentures or bridgework may not be worn into surgery.  Leave suitcase in the car. After surgery it may be brought to your room.     Patients discharged the day of surgery will not be allowed to drive home. IF YOU ARE HAVING SURGERY AND GOING HOME THE SAME DAY, YOU MUST HAVE AN ADULT TO DRIVE YOU HOME AND BE WITH YOU FOR 24 HOURS. YOU MAY GO HOME BY TAXI OR UBER OR ORTHERWISE, BUT AN ADULT MUST ACCOMPANY YOU HOME AND STAY WITH YOU FOR 24 HOURS.  Name and phone number of your driver:  Special Instructions: N/A              Please read over the following fact sheets you were given: _____________________________________________________________________  [facility] - Preparing for Surgery Before surgery, you can play an important role.  Because skin is not sterile, your skin needs to be as free of germs as possible.  You can reduce the number of germs on your skin by washing with CHG (chlorahexidine gluconate) soap before  surgery.  CHG is an antiseptic cleaner which kills germs and bonds with the skin to continue killing germs even after  washing. Please DO NOT use if you have an allergy to CHG or antibacterial soaps.  If your skin becomes reddened/irritated stop using the CHG and inform your nurse when you arrive at Short Stay. Do not shave (including legs and underarms) for at least 48 hours prior to the first CHG shower.  You may shave your face/neck. Please follow these instructions carefully:  1.  Shower with CHG Soap the night before surgery and the  morning of Surgery.  2.  If you choose to wash your hair, wash your hair first as usual with your  normal  shampoo.  3.  After you shampoo, rinse your hair and body thoroughly to remove the  shampoo.                           4.  Use CHG as you would any other liquid soap.  You can apply chg directly  to the skin and wash                       Gently with a scrungie or clean washcloth.  5.  Apply the CHG Soap to your body ONLY FROM THE NECK DOWN.   Do not use on face/ open                           Wound or open sores. Avoid contact with eyes, ears mouth and genitals (private parts).                       Wash face,  Genitals (private parts) with your normal soap.             6.  Wash thoroughly, paying special attention to the area where your surgery  will be performed.  7.  Thoroughly rinse your body with warm water from the neck down.  8.  DO NOT shower/wash with your normal soap after using and rinsing off  the CHG Soap.                9.  Pat yourself dry with a clean towel.            10.  Wear clean pajamas.            11.  Place clean sheets on your bed the night of your first shower and do not  sleep with pets. Day of Surgery : Do not apply any lotions/deodorants the morning of surgery.  Please wear clean clothes to the hospital/surgery center.  FAILURE TO FOLLOW THESE INSTRUCTIONS MAY RESULT IN THE CANCELLATION OF YOUR SURGERY PATIENT SIGNATURE_________________________________  NURSE  SIGNATURE__________________________________  ________________________________________________________________________   Monica Blanchard  An incentive spirometer is a tool that can help keep your lungs clear and active. This tool measures how well you are filling your lungs with each breath. Taking long deep breaths may help reverse or decrease the chance of developing breathing (pulmonary) problems (especially infection) following:  A long period of time when you are unable to move or be active. BEFORE THE PROCEDURE   If the spirometer includes an indicator to show your best effort, your nurse or respiratory therapist will set it to a desired goal.  If possible, sit up straight or lean slightly forward. Try not  to slouch.  Hold the incentive spirometer in an upright position. INSTRUCTIONS FOR USE  1. Sit on the edge of your bed if possible, or sit up as far as you can in bed or on a chair. 2. Hold the incentive spirometer in an upright position. 3. Breathe out normally. 4. Place the mouthpiece in your mouth and seal your lips tightly around it. 5. Breathe in slowly and as deeply as possible, raising the piston or the ball toward the top of the column. 6. Hold your breath for 3-5 seconds or for as long as possible. Allow the piston or ball to fall to the bottom of the column. 7. Remove the mouthpiece from your mouth and breathe out normally. 8. Rest for a few seconds and repeat Steps 1 through 7 at least 10 times every 1-2 hours when you are awake. Take your time and take a few normal breaths between deep breaths. 9. The spirometer may include an indicator to show your best effort. Use the indicator as a goal to work toward during each repetition. 10. After each set of 10 deep breaths, practice coughing to be sure your lungs are clear. If you have an incision (the cut made at the time of surgery), support your incision when coughing by placing a pillow or rolled up towels firmly  against it. Once you are able to get out of bed, walk around indoors and cough well. You may stop using the incentive spirometer when instructed by your caregiver.  RISKS AND COMPLICATIONS  Take your time so you do not get dizzy or light-headed.  If you are in pain, you may need to take or ask for pain medication before doing incentive spirometry. It is harder to take a deep breath if you are having pain. AFTER USE  Rest and breathe slowly and easily.  It can be helpful to keep track of a log of your progress. Your caregiver can provide you with a simple table to help with this. If you are using the spirometer at home, follow these instructions: SEEK MEDICAL CARE IF:   You are having difficultly using the spirometer.  You have trouble using the spirometer as often as instructed.  Your pain medication is not giving enough relief while using the spirometer.  You develop fever of 100.5 F (38.1 C) or higher. SEEK IMMEDIATE MEDICAL CARE IF:   You cough up bloody sputum that had not been present before.  You develop fever of 102 F (38.9 C) or greater.  You develop worsening pain at or near the incision site. MAKE SURE YOU:   Understand these instructions.  Will watch your condition.  Will get help right away if you are not doing well or get worse. Document Released: 07/27/2006 Document Revised: 06/08/2011 Document Reviewed: 09/27/2006 [employer] Patient Information 2014 [employer], [place].   ________________________________________________________________________   Incentive Spirometer  An incentive spirometer is a tool that can help keep your lungs clear and active. This tool measures how well you are filling your lungs with each breath. Taking long deep breaths may help reverse or decrease the chance of developing breathing (pulmonary) problems (especially infection) following:  A long period of time when you are unable to move or be active. BEFORE THE PROCEDURE   If the  spirometer includes an indicator to show your best effort, your nurse or respiratory therapist will set it to a desired goal.  If possible, sit up straight or lean slightly forward. Try not to slouch.  Hold the  incentive spirometer in an upright position. INSTRUCTIONS FOR USE  11. Sit on the edge of your bed if possible, or sit up as far as you can in bed or on a chair. 12. Hold the incentive spirometer in an upright position. 13. Breathe out normally. 14. Place the mouthpiece in your mouth and seal your lips tightly around it. 15. Breathe in slowly and as deeply as possible, raising the piston or the ball toward the top of the column. 16. Hold your breath for 3-5 seconds or for as long as possible. Allow the piston or ball to fall to the bottom of the column. 17. Remove the mouthpiece from your mouth and breathe out normally. 18. Rest for a few seconds and repeat Steps 1 through 7 at least 10 times every 1-2 hours when you are awake. Take your time and take a few normal breaths between deep breaths. 19. The spirometer may include an indicator to show your best effort. Use the indicator as a goal to work toward during each repetition. 20. After each set of 10 deep breaths, practice coughing to be sure your lungs are clear. If you have an incision (the cut made at the time of surgery), support your incision when coughing by placing a pillow or rolled up towels firmly against it. Once you are able to get out of bed, walk around indoors and cough well. You may stop using the incentive spirometer when instructed by your caregiver.  RISKS AND COMPLICATIONS  Take your time so you do not get dizzy or light-headed.  If you are in pain, you may need to take or ask for pain medication before doing incentive spirometry. It is harder to take a deep breath if you are having pain. AFTER USE  Rest and breathe slowly and easily.  It can be helpful to keep track of a log of your progress. Your caregiver can  provide you with a simple table to help with this. If you are using the spirometer at home, follow these instructions: SEEK MEDICAL CARE IF:   You are having difficultly using the spirometer.  You have trouble using the spirometer as often as instructed.  Your pain medication is not giving enough relief while using the spirometer.  You develop fever of 100.5 F (38.1 C) or higher. SEEK IMMEDIATE MEDICAL CARE IF:   You cough up bloody sputum that had not been present before.  You develop fever of 102 F (38.9 C) or greater.  You develop worsening pain at or near the incision site. MAKE SURE YOU:   Understand these instructions.  Will watch your condition.  Will get help right away if you are not doing well or get worse. Document Released: 07/27/2006 Document Revised: 06/08/2011 Document Reviewed: 09/27/2006 [facility] Patient Information 2014 [place], [place].   ________________________________________________________________________

## 2019-09-27 ENCOUNTER — Encounter (HOSPITAL_COMMUNITY)
Admission: RE | Admit: 2019-09-27 | Discharge: 2019-09-27 | Disposition: A | Discharge status: Home / Self Care | Payer: Medicaid Other | Source: Ambulatory Visit | Attending: Orthopaedic Surgery | Admitting: Orthopaedic Surgery

## 2019-09-27 ENCOUNTER — Other Ambulatory Visit: Payer: Self-pay

## 2019-09-27 ENCOUNTER — Encounter (HOSPITAL_COMMUNITY): Payer: Self-pay

## 2019-09-27 ENCOUNTER — Telehealth: Payer: Self-pay | Admitting: Orthopaedic Surgery

## 2019-09-27 DIAGNOSIS — Z01818 Encounter for other preprocedural examination: Secondary | ICD-10-CM | POA: Diagnosis present

## 2019-09-27 HISTORY — DX: Anemia, unspecified: D64.9

## 2019-09-27 HISTORY — DX: Unspecified osteoarthritis, unspecified site: M19.90

## 2019-09-27 LAB — BASIC METABOLIC PANEL
Anion gap: 11 (ref 5–15)
BUN: 16 mg/dL (ref 6–20)
CO2: 23 mmol/L (ref 22–32)
Calcium: 9.3 mg/dL (ref 8.9–10.3)
Chloride: 108 mmol/L (ref 98–111)
Creatinine, Ser: 0.81 mg/dL (ref 0.44–1.00)
GFR calc Af Amer: >60 mL/min (ref >60)
GFR calc non Af Amer: >60 mL/min (ref >60)
Glucose, Bld: 134 mg/dL — ABNORMAL HIGH (ref 70–99)
Potassium: 4.2 mmol/L (ref 3.5–5.1)
Sodium: 142 mmol/L (ref 135–145)

## 2019-09-27 LAB — CBC
HCT: 40 % (ref 36.0–46.0)
Hemoglobin: 12.4 g/dL (ref 12.0–15.0)
MCH: 27 pg (ref 26.0–34.0)
MCHC: 31 g/dL (ref 30.0–36.0)
MCV: 87.1 fL (ref 80.0–100.0)
Platelets: 364 10*3/uL (ref 150–400)
RBC: 4.59 MIL/uL (ref 3.87–5.11)
RDW: 13.7 % (ref 11.5–15.5)
WBC: 8.7 10*3/uL (ref 4.0–10.5)
nRBC: 0 % (ref 0.0–0.2)

## 2019-09-27 MED ORDER — HYDROCODONE-ACETAMINOPHEN 5-325 MG PO TABS: 1.0000 | ORAL_TABLET | Freq: Four times a day (QID) | ORAL | 0 refills | Status: DC | PRN

## 2019-09-27 NOTE — Telephone Encounter (Signed)
LMOM for patient of the below message  

## 2019-09-27 NOTE — Telephone Encounter (Signed)
Patient called. She would like a RX for oxycodone called in. Says the Tramadol does not work for her. Her call back number is [phone]

## 2019-09-27 NOTE — Progress Notes (Signed)
COVID Vaccine Completed:No Date COVID Vaccine completed: COVID vaccine manufacturer: [employer]   PCP - Dr. Synetta Fail. LOV: 02/2019 Cardiologist - No  Chest x-ray -  EKG - 09/27/19 Stress Test -  ECHO -  Cardiac Cath -   Sleep Study -  CPAP -   Fasting Blood Sugar -  Checks Blood Sugar _____ times a day  Blood Thinner Instructions: Aspirin Instructions: Last Dose:  Anesthesia review: Hx: HTN. Pt. Is currently on three blood pressure medications. BP during PST visit was 141/99, as per pt. She run out of her lisinopril and pain medicine and that's probably why her BP was high, RN advise pt. To get her lisinopril and to be consistent with her medicine.Pt. was also advise to stop smoking before surgery.  Patient denies shortness of breath, fever, cough and chest pain at PAT appointment   Patient verbalized understanding of instructions that were given to them at the PAT appointment. Patient was also instructed that they will need to review over the PAT instructions again at home before surgery.

## 2019-09-27 NOTE — Telephone Encounter (Signed)
Let her know that I sent in some hydrocodone.  I would like to reserve oxycodone until after surgery.

## 2019-09-27 NOTE — Telephone Encounter (Signed)
Please advise 

## 2019-09-28 LAB — SURGICAL PCR SCREEN
MRSA, PCR: POSITIVE — AB
Staphylococcus aureus: POSITIVE — AB

## 2019-09-28 NOTE — Anesthesia Preprocedure Evaluation (Addendum)
Anesthesia Evaluation  Patient identified by MRN, date of birth, ID band Patient awake    Reviewed: Allergy & Precautions, NPO status , Patient's Chart, lab work & pertinent test results  Airway Mallampati: II  TM Distance: >3 FB Neck ROM: Full    Dental no notable dental hx.    Pulmonary neg pulmonary ROS, Current Smoker and Patient abstained from smoking.,    Pulmonary exam normal breath sounds clear to auscultation       Cardiovascular hypertension, Pt. on medications negative cardio ROS Normal cardiovascular exam Rhythm:Regular Rate:Normal     Neuro/Psych negative neurological ROS  negative psych ROS   GI/Hepatic negative GI ROS, Neg liver ROS,   Endo/Other  negative endocrine ROS  Renal/GU negative Renal ROS  negative genitourinary   Musculoskeletal  (+) Arthritis , Osteoarthritis,    Abdominal (+) + obese,   Peds negative pediatric ROS (+)  Hematology negative hematology ROS (+)   Anesthesia Other Findings   Reproductive/Obstetrics negative OB ROS                            Anesthesia Physical Anesthesia Plan  ASA: II  Anesthesia Plan: Spinal   Post-op Pain Management:    Induction: Intravenous  PONV Risk Score and Plan: 1 and Ondansetron and Treatment may vary due to age or medical condition  Airway Management Planned: Simple Face Mask  Additional Equipment:   Intra-op Plan:   Post-operative Plan:   Informed Consent: I have reviewed the patients History and Physical, chart, labs and discussed the procedure including the risks, benefits and alternatives for the proposed anesthesia with the patient or authorized representative who has indicated his/her understanding and acceptance.     Dental advisory given  Plan Discussed with: CRNA  Anesthesia Plan Comments: (See APP note by Joslyn Hy, FNP)       Anesthesia Quick Evaluation

## 2019-09-28 NOTE — Progress Notes (Signed)
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 Pt reported to RN she had run out of lisinopril and therefore hasn't been taking it. RN instructed pt to refill med and resume taking.    VS: BP (!) 141/99   Pulse 80   Temp 37.2 C (Oral)   Resp (!) 22   Ht 5\' 9"  (1.753 m)   Wt 103.4 kg   BMI 33.67 kg/m    PROVIDERS: - PCP is ., MD   LABS: Labs reviewed: Acceptable for surgery. (all labs ordered are listed, but only abnormal results are displayed)  Labs Reviewed  SURGICAL PCR SCREEN - Abnormal; Notable for the following components:      Result Value   MRSA, PCR POSITIVE (*)    Staphylococcus aureus POSITIVE (*)    All other components within normal limits  BASIC METABOLIC PANEL - Abnormal; Notable for the following components:   Glucose, Bld 134 (*)    All other components within normal limits  CBC  TYPE AND SCREEN    EKG 09/27/19: NSR. Possible Left atrial enlargement Left ventricular hypertrophy    CV: N/A   Past Medical History:  Diagnosis Date  . Anemia   . Arthritis   . Ectopic pregnancy    x 2  . Hypertension     Past Surgical History:  Procedure Laterality Date  . APPENDECTOMY    . CESAREAN SECTION    . ECTOPIC PREGNANCY SURGERY    . WRIST FRACTURE SURGERY      MEDICATIONS: . amLODipine (NORVASC) 10 MG tablet  . benazepril-hydrochlorthiazide (LOTENSIN HCT) 20-25 MG tablet  . hydrochlorothiazide (MICROZIDE) 12.5 MG capsule  . HYDROcodone-acetaminophen (NORCO/VICODIN) 5-325 MG tablet  . lisinopril (ZESTRIL) 20 MG tablet  . meloxicam (MOBIC) 15 MG tablet  . naproxen  (NAPROSYN) 500 MG tablet  . oxyCODONE-acetaminophen (PERCOCET/ROXICET) 5-325 MG tablet  . traMADol (ULTRAM) 50 MG tablet   No current facility-administered medications for this encounter.    If BP acceptable day of surgery, I anticipate pt can proceed with surgery as scheduled.  09/29/19, FNP-BC [facility] Short Stay Surgical Center/Anesthesiology Phone: [phone] 09/28/2019 12:47 PM

## 2019-09-29 ENCOUNTER — Telehealth: Payer: Self-pay

## 2019-09-29 ENCOUNTER — Telehealth: Payer: Self-pay | Admitting: Orthopaedic Surgery

## 2019-09-29 NOTE — Telephone Encounter (Signed)
Please call [employer] @ [phone]. They have questions about the pt.  Reference # [id number]

## 2019-09-29 NOTE — Telephone Encounter (Signed)
Spoke with patient. Advised we received letter from [facility] but, there was no APS attached as the letter referenced. She is going to reach out to them to have that re faxed. She gave VE[facility] AR to release info to [employer].

## 2019-10-03 ENCOUNTER — Other Ambulatory Visit (HOSPITAL_COMMUNITY)
Admission: RE | Admit: 2019-10-03 | Discharge: 2019-10-03 | Disposition: A | Discharge status: Home / Self Care | Payer: Medicaid Other | Source: Ambulatory Visit | Attending: Orthopaedic Surgery | Admitting: Orthopaedic Surgery

## 2019-10-03 ENCOUNTER — Telehealth: Payer: Self-pay | Admitting: Orthopaedic Surgery

## 2019-10-03 DIAGNOSIS — Z20822 Contact with and (suspected) exposure to covid-19: Secondary | ICD-10-CM | POA: Insufficient documentation

## 2019-10-03 DIAGNOSIS — Z01812 Encounter for preprocedural laboratory examination: Secondary | ICD-10-CM | POA: Insufficient documentation

## 2019-10-03 LAB — SARS CORONAVIRUS 2 (TAT 6-24 HRS): SARS Coronavirus 2: NEGATIVE

## 2019-10-03 NOTE — Telephone Encounter (Signed)
Forms received from [employer]. Sent to [employer]. 

## 2019-10-05 MED ORDER — VANCOMYCIN HCL 1500 MG/300ML IV SOLN
1500.0000 mg | INTRAVENOUS | Status: AC
  Administered 2019-10-06: 1500 mg via INTRAVENOUS
  Filled 2019-10-05: qty 300

## 2019-10-05 NOTE — H&P (Signed)
TOTAL HIP ADMISSION H&P  Patient is admitted for left total hip arthroplasty.  Subjective:  Chief Complaint: left hip pain  HPI: Monica Blanchard, 44 y.o. female, has a history of pain and functional disability in the left hip(s) due to avascular necrosis and patient has failed non-surgical conservative treatments for greater than 12 weeks to include NSAID's and/or analgesics, use of assistive devices and activity modification.  Onset of symptoms was abrupt starting 1 years ago with rapidlly worsening course since that time.The patient noted no past surgery on the left hip(s).  Patient currently rates pain in the left hip at 10 out of 10 with activity. Patient has night pain, worsening of pain with activity and weight bearing, pain that interfers with activities of daily living and pain with passive range of motion. Patient has evidence of subchondral cysts and femoarl head subchondral fracture with flattening/collapse by imaging studies. This condition presents safety issues increasing the risk of falls.  There is no current active infection.  Patient Active Problem List   Diagnosis Date Noted  . Avascular necrosis of bone of left hip (HCC) 09/19/2019   Past Medical History:  Diagnosis Date  . Anemia   . Arthritis   . Ectopic pregnancy    x 2  . Hypertension     Past Surgical History:  Procedure Laterality Date  . APPENDECTOMY    . CESAREAN SECTION    . ECTOPIC PREGNANCY SURGERY    . WRIST FRACTURE SURGERY      Current Facility-Administered Medications  Medication Dose Route Frequency Provider Last Rate Last Admin  . [START ON 10/06/2019] vancomycin (VANCOREADY) IVPB 1500 mg/300 mL  1,500 mg Intravenous 30 min Pre-Op Kathryne Hitch, MD       Current Outpatient Medications  Medication Sig Dispense Refill Last Dose  . amLODipine (NORVASC) 10 MG tablet Take 10 mg by mouth daily.     . hydrochlorothiazide (MICROZIDE) 12.5 MG capsule Take 12.5 mg by mouth daily.     Marland Kitchen lisinopril  (ZESTRIL) 20 MG tablet Take 20 mg by mouth daily.     . meloxicam (MOBIC) 15 MG tablet Take 1 tablet (15 mg total) by mouth daily. 30 tablet 0   . oxyCODONE-acetaminophen (PERCOCET/ROXICET) 5-325 MG tablet Take 1 tablet by mouth every 4 (four) hours as needed for severe pain. 20 tablet 0   . benazepril-hydrochlorthiazide (LOTENSIN HCT) 20-25 MG tablet Take 1 tablet by mouth daily. (Patient not taking: Reported on 09/20/2019)   Not Taking at Unknown time  . HYDROcodone-acetaminophen (NORCO/VICODIN) 5-325 MG tablet Take 1 tablet by mouth every 6 (six) hours as needed for moderate pain. 30 tablet 0   . naproxen (NAPROSYN) 500 MG tablet Take 1 tablet (500 mg total) by mouth 2 (two) times daily. (Patient not taking: Reported on 09/04/2019) 30 tablet 0   . traMADol (ULTRAM) 50 MG tablet Take 1 tablet (50 mg total) by mouth every 12 (twelve) hours as needed. (Patient not taking: Reported on 09/20/2019) 30 tablet 0 Not Taking at Unknown time   No Known Allergies  Social History   Tobacco Use  . Smoking status: Current Every Day Smoker    Packs/day: 0.50    Years: 15.00    Pack years: 7.50    Types: Cigarettes  . Smokeless tobacco: Never Used  Substance Use Topics  . Alcohol use: Yes    Comment: occ    Family History  Problem Relation Age of Onset  . Diabetes Other   . Hypertension Other  Review of Systems  All other systems reviewed and are negative.   Objective:  Physical Exam Vitals reviewed.  Constitutional:      Appearance: Normal appearance.  HENT:     Head: Normocephalic and atraumatic.  Eyes:     Pupils: Pupils are equal, round, and reactive to light.  Cardiovascular:     Rate and Rhythm: Normal rate.     Pulses: Normal pulses.  Pulmonary:     Effort: Pulmonary effort is normal.  Abdominal:     Palpations: Abdomen is soft.  Musculoskeletal:     Cervical back: Normal range of motion.     Left hip: Tenderness and bony tenderness present. Decreased range of motion.  Decreased strength.  Skin:    General: Skin is warm.  Neurological:     General: No focal deficit present.     Mental Status: She is alert.  Psychiatric:        Behavior: Behavior normal.     Vital signs in last 24 hours:    Labs:   Estimated body mass index is 33.67 kg/m as calculated from the following:   Height as of 09/27/19: 5\' 9"  (1.753 m).   Weight as of 09/27/19: 103.4 kg.   Imaging Review Plain radiographs demonstrate severe AVN of the left hip(s). The bone quality appears to be good for age and reported activity level. There is a subchondral fracture of the femoral head.      Assessment/Plan:  End stage avascular necrosis, left hip(s)  The patient history, physical examination, clinical judgement of the provider and imaging studies are consistent with AVN of the left hip(s) and total hip arthroplasty is deemed medically necessary. The treatment options including medical management, injection therapy, arthroscopy and arthroplasty were discussed at length. The risks and benefits of total hip arthroplasty were presented and reviewed. The risks due to aseptic loosening, infection, stiffness, dislocation/subluxation,  thromboembolic complications and other imponderables were discussed.  The patient acknowledged the explanation, agreed to proceed with the plan and consent was signed. Patient is being admitted for inpatient treatment for surgery, pain control, PT, OT, prophylactic antibiotics, VTE prophylaxis, progressive ambulation and ADL's and discharge planning.The patient is planning to be discharged home with home health services

## 2019-10-06 ENCOUNTER — Encounter (HOSPITAL_COMMUNITY)
Admission: RE | Disposition: A | Discharge status: Home / Self Care | Payer: Self-pay | Source: Home / Self Care | Attending: Orthopaedic Surgery

## 2019-10-06 ENCOUNTER — Observation Stay (HOSPITAL_COMMUNITY)
Admission: RE | Admit: 2019-10-06 | Discharge: 2019-10-07 | Disposition: A | Discharge status: Home / Self Care | Payer: Medicaid Other | Attending: Orthopaedic Surgery | Admitting: Orthopaedic Surgery

## 2019-10-06 ENCOUNTER — Ambulatory Visit (HOSPITAL_COMMUNITY): Payer: Medicaid Other

## 2019-10-06 ENCOUNTER — Ambulatory Visit (HOSPITAL_COMMUNITY): Payer: Medicaid Other | Admitting: Emergency Medicine

## 2019-10-06 ENCOUNTER — Other Ambulatory Visit: Payer: Self-pay

## 2019-10-06 ENCOUNTER — Observation Stay (HOSPITAL_COMMUNITY): Payer: Medicaid Other

## 2019-10-06 ENCOUNTER — Encounter (HOSPITAL_COMMUNITY): Payer: Self-pay | Admitting: Orthopaedic Surgery

## 2019-10-06 DIAGNOSIS — Z96642 Presence of left artificial hip joint: Secondary | ICD-10-CM

## 2019-10-06 DIAGNOSIS — Z79899 Other long term (current) drug therapy: Secondary | ICD-10-CM | POA: Insufficient documentation

## 2019-10-06 DIAGNOSIS — M25552 Pain in left hip: Secondary | ICD-10-CM | POA: Diagnosis present

## 2019-10-06 DIAGNOSIS — M87052 Idiopathic aseptic necrosis of left femur: Principal | ICD-10-CM | POA: Diagnosis present

## 2019-10-06 DIAGNOSIS — I10 Essential (primary) hypertension: Secondary | ICD-10-CM | POA: Insufficient documentation

## 2019-10-06 DIAGNOSIS — Z419 Encounter for procedure for purposes other than remedying health state, unspecified: Secondary | ICD-10-CM

## 2019-10-06 DIAGNOSIS — F17210 Nicotine dependence, cigarettes, uncomplicated: Secondary | ICD-10-CM | POA: Diagnosis not present

## 2019-10-06 HISTORY — PX: TOTAL HIP ARTHROPLASTY: SHX124

## 2019-10-06 LAB — TYPE AND SCREEN
ABO/RH(D): B POS
Antibody Screen: NEGATIVE

## 2019-10-06 LAB — PREGNANCY, URINE: Preg Test, Ur: NEGATIVE

## 2019-10-06 LAB — ABO/RH: ABO/RH(D): B POS

## 2019-10-06 SURGERY — ARTHROPLASTY, HIP, TOTAL, ANTERIOR APPROACH
Anesthesia: Spinal | Site: Hip | Laterality: Left

## 2019-10-06 MED ORDER — METHOCARBAMOL 500 MG PO TABS
500.0000 mg | ORAL_TABLET | Freq: Four times a day (QID) | ORAL | Status: DC | PRN
  Administered 2019-10-06 – 2019-10-07 (×3): 500 mg via ORAL
  Filled 2019-10-06 (×3): qty 1

## 2019-10-06 MED ORDER — FENTANYL CITRATE (PF) 100 MCG/2ML IJ SOLN
INTRAMUSCULAR | Status: AC
  Filled 2019-10-06: qty 2

## 2019-10-06 MED ORDER — LISINOPRIL 20 MG PO TABS
20.0000 mg | ORAL_TABLET | Freq: Every day | ORAL | Status: DC
  Administered 2019-10-06 – 2019-10-07 (×2): 20 mg via ORAL
  Filled 2019-10-06 (×2): qty 1

## 2019-10-06 MED ORDER — TRANEXAMIC ACID-NACL 1000-0.7 MG/100ML-% IV SOLN
1000.0000 mg | INTRAVENOUS | Status: AC
  Administered 2019-10-06: 1000 mg via INTRAVENOUS
  Filled 2019-10-06: qty 100

## 2019-10-06 MED ORDER — POLYETHYLENE GLYCOL 3350 17 G PO PACK
17.0000 g | PACK | Freq: Every day | ORAL | Status: DC | PRN
  Administered 2019-10-07: 17 g via ORAL
  Filled 2019-10-06: qty 1

## 2019-10-06 MED ORDER — METHOCARBAMOL 1000 MG/10ML IJ SOLN
500.0000 mg | Freq: Four times a day (QID) | INTRAVENOUS | Status: DC | PRN
  Filled 2019-10-06: qty 5

## 2019-10-06 MED ORDER — ORAL CARE MOUTH RINSE: 15.0000 mL | Freq: Once | OROMUCOSAL | Status: AC

## 2019-10-06 MED ORDER — STERILE WATER FOR IRRIGATION IR SOLN
Status: DC | PRN
  Administered 2019-10-06 (×2): 1000 mL

## 2019-10-06 MED ORDER — PROMETHAZINE HCL 25 MG/ML IJ SOLN: 6.2500 mg | INTRAMUSCULAR | Status: DC | PRN

## 2019-10-06 MED ORDER — OXYCODONE HCL 5 MG PO TABS
ORAL_TABLET | ORAL | Status: AC
  Filled 2019-10-06: qty 1

## 2019-10-06 MED ORDER — OXYCODONE HCL 5 MG PO TABS
10.0000 mg | ORAL_TABLET | ORAL | Status: DC | PRN
  Administered 2019-10-06: 10 mg via ORAL
  Administered 2019-10-07 (×3): 15 mg via ORAL
  Filled 2019-10-06 (×3): qty 3

## 2019-10-06 MED ORDER — OXYCODONE HCL 5 MG PO TABS
5.0000 mg | ORAL_TABLET | Freq: Once | ORAL | Status: AC | PRN
  Administered 2019-10-06: 5 mg via ORAL

## 2019-10-06 MED ORDER — DIPHENHYDRAMINE HCL 12.5 MG/5ML PO ELIX: 12.5000 mg | ORAL_SOLUTION | ORAL | Status: DC | PRN

## 2019-10-06 MED ORDER — DIPHENHYDRAMINE HCL 50 MG/ML IJ SOLN
INTRAMUSCULAR | Status: AC
  Filled 2019-10-06: qty 1

## 2019-10-06 MED ORDER — PHENYLEPHRINE HCL-NACL 20-0.9 MG/250ML-% IV SOLN
INTRAVENOUS | Status: DC | PRN
  Administered 2019-10-06: 20 ug/min via INTRAVENOUS

## 2019-10-06 MED ORDER — OXYCODONE HCL 5 MG/5ML PO SOLN: 5.0000 mg | Freq: Once | ORAL | Status: AC | PRN

## 2019-10-06 MED ORDER — CEFAZOLIN SODIUM-DEXTROSE 1-4 GM/50ML-% IV SOLN
1.0000 g | Freq: Four times a day (QID) | INTRAVENOUS | Status: AC
  Administered 2019-10-06 – 2019-10-07 (×2): 1 g via INTRAVENOUS
  Filled 2019-10-06 (×2): qty 50

## 2019-10-06 MED ORDER — CEFAZOLIN SODIUM-DEXTROSE 2-4 GM/100ML-% IV SOLN
2.0000 g | INTRAVENOUS | Status: AC
  Administered 2019-10-06: 2 g via INTRAVENOUS
  Filled 2019-10-06: qty 100

## 2019-10-06 MED ORDER — DEXAMETHASONE SODIUM PHOSPHATE 10 MG/ML IJ SOLN
INTRAMUSCULAR | Status: DC | PRN
  Administered 2019-10-06: 8 mg via INTRAVENOUS

## 2019-10-06 MED ORDER — MIDAZOLAM HCL 5 MG/5ML IJ SOLN
INTRAMUSCULAR | Status: DC | PRN
  Administered 2019-10-06: 2 mg via INTRAVENOUS

## 2019-10-06 MED ORDER — SODIUM CHLORIDE 0.9 % IR SOLN
Status: DC | PRN
  Administered 2019-10-06: 1000 mL

## 2019-10-06 MED ORDER — ZOLPIDEM TARTRATE 5 MG PO TABS: 5.0000 mg | ORAL_TABLET | Freq: Every evening | ORAL | Status: DC | PRN

## 2019-10-06 MED ORDER — HYDROCHLOROTHIAZIDE 12.5 MG PO CAPS
12.5000 mg | ORAL_CAPSULE | Freq: Every day | ORAL | Status: DC
  Administered 2019-10-06 – 2019-10-07 (×2): 12.5 mg via ORAL
  Filled 2019-10-06 (×2): qty 1

## 2019-10-06 MED ORDER — POVIDONE-IODINE 10 % EX SWAB: 2.0000 "application " | Freq: Once | CUTANEOUS | Status: DC

## 2019-10-06 MED ORDER — FENTANYL CITRATE (PF) 100 MCG/2ML IJ SOLN
INTRAMUSCULAR | Status: DC | PRN
  Administered 2019-10-06: 50 ug via INTRAVENOUS

## 2019-10-06 MED ORDER — METOCLOPRAMIDE HCL 5 MG PO TABS: 5.0000 mg | ORAL_TABLET | Freq: Three times a day (TID) | ORAL | Status: DC | PRN

## 2019-10-06 MED ORDER — ONDANSETRON HCL 4 MG/2ML IJ SOLN: 4.0000 mg | Freq: Four times a day (QID) | INTRAMUSCULAR | Status: DC | PRN

## 2019-10-06 MED ORDER — 0.9 % SODIUM CHLORIDE (POUR BTL) OPTIME
TOPICAL | Status: DC | PRN
  Administered 2019-10-06: 1000 mL

## 2019-10-06 MED ORDER — DOCUSATE SODIUM 100 MG PO CAPS
100.0000 mg | ORAL_CAPSULE | Freq: Two times a day (BID) | ORAL | Status: DC
  Administered 2019-10-06 – 2019-10-07 (×2): 100 mg via ORAL
  Filled 2019-10-06 (×2): qty 1

## 2019-10-06 MED ORDER — ACETAMINOPHEN 325 MG PO TABS
325.0000 mg | ORAL_TABLET | Freq: Four times a day (QID) | ORAL | Status: DC | PRN
  Administered 2019-10-06 – 2019-10-07 (×2): 650 mg via ORAL
  Filled 2019-10-06 (×2): qty 2

## 2019-10-06 MED ORDER — DEXAMETHASONE SODIUM PHOSPHATE 10 MG/ML IJ SOLN
INTRAMUSCULAR | Status: AC
  Filled 2019-10-06: qty 1

## 2019-10-06 MED ORDER — BUPIVACAINE IN DEXTROSE 0.75-8.25 % IT SOLN
INTRATHECAL | Status: DC | PRN
  Administered 2019-10-06: 2 mL via INTRATHECAL

## 2019-10-06 MED ORDER — PHENYLEPHRINE HCL (PRESSORS) 10 MG/ML IV SOLN
INTRAVENOUS | Status: AC
  Filled 2019-10-06: qty 2

## 2019-10-06 MED ORDER — GABAPENTIN 100 MG PO CAPS
100.0000 mg | ORAL_CAPSULE | Freq: Three times a day (TID) | ORAL | Status: DC
  Administered 2019-10-06 – 2019-10-07 (×3): 100 mg via ORAL
  Filled 2019-10-06 (×3): qty 1

## 2019-10-06 MED ORDER — ONDANSETRON HCL 4 MG/2ML IJ SOLN
INTRAMUSCULAR | Status: DC | PRN
  Administered 2019-10-06: 4 mg via INTRAVENOUS

## 2019-10-06 MED ORDER — HYDROMORPHONE HCL 1 MG/ML IJ SOLN
0.2500 mg | INTRAMUSCULAR | Status: DC | PRN
  Administered 2019-10-06 (×2): 0.5 mg via INTRAVENOUS

## 2019-10-06 MED ORDER — EPHEDRINE 5 MG/ML INJ
INTRAVENOUS | Status: AC
  Filled 2019-10-06: qty 10

## 2019-10-06 MED ORDER — CHLORHEXIDINE GLUCONATE 0.12 % MT SOLN
15.0000 mL | Freq: Once | OROMUCOSAL | Status: AC
  Administered 2019-10-06: 15 mL via OROMUCOSAL

## 2019-10-06 MED ORDER — ONDANSETRON HCL 4 MG/2ML IJ SOLN
INTRAMUSCULAR | Status: AC
  Filled 2019-10-06: qty 2

## 2019-10-06 MED ORDER — HYDROMORPHONE HCL 1 MG/ML IJ SOLN
INTRAMUSCULAR | Status: AC
  Filled 2019-10-06: qty 1

## 2019-10-06 MED ORDER — AMLODIPINE BESYLATE 10 MG PO TABS
10.0000 mg | ORAL_TABLET | Freq: Every day | ORAL | Status: DC
  Administered 2019-10-07: 10 mg via ORAL
  Filled 2019-10-06: qty 1

## 2019-10-06 MED ORDER — ASPIRIN 81 MG PO CHEW
81.0000 mg | CHEWABLE_TABLET | Freq: Two times a day (BID) | ORAL | Status: DC
  Administered 2019-10-06 – 2019-10-07 (×2): 81 mg via ORAL
  Filled 2019-10-06 (×2): qty 1

## 2019-10-06 MED ORDER — PROPOFOL 500 MG/50ML IV EMUL
INTRAVENOUS | Status: DC | PRN
  Administered 2019-10-06: 40 ug/kg/min via INTRAVENOUS

## 2019-10-06 MED ORDER — PHENOL 1.4 % MT LIQD: 1.0000 | OROMUCOSAL | Status: DC | PRN

## 2019-10-06 MED ORDER — LACTATED RINGERS IV SOLN: INTRAVENOUS | Status: DC

## 2019-10-06 MED ORDER — SODIUM CHLORIDE 0.9 % IV SOLN: INTRAVENOUS | Status: DC

## 2019-10-06 MED ORDER — PROPOFOL 1000 MG/100ML IV EMUL
INTRAVENOUS | Status: AC
  Filled 2019-10-06: qty 100

## 2019-10-06 MED ORDER — MIDAZOLAM HCL 2 MG/2ML IJ SOLN
INTRAMUSCULAR | Status: AC
  Filled 2019-10-06: qty 2

## 2019-10-06 MED ORDER — PANTOPRAZOLE SODIUM 40 MG PO TBEC
40.0000 mg | DELAYED_RELEASE_TABLET | Freq: Every day | ORAL | Status: DC
  Administered 2019-10-06 – 2019-10-07 (×2): 40 mg via ORAL
  Filled 2019-10-06 (×2): qty 1

## 2019-10-06 MED ORDER — EPHEDRINE SULFATE-NACL 50-0.9 MG/10ML-% IV SOSY
PREFILLED_SYRINGE | INTRAVENOUS | Status: DC | PRN
  Administered 2019-10-06: 5 mg via INTRAVENOUS

## 2019-10-06 MED ORDER — METOCLOPRAMIDE HCL 5 MG/ML IJ SOLN: 5.0000 mg | Freq: Three times a day (TID) | INTRAMUSCULAR | Status: DC | PRN

## 2019-10-06 MED ORDER — DIPHENHYDRAMINE HCL 50 MG/ML IJ SOLN
INTRAMUSCULAR | Status: DC | PRN
  Administered 2019-10-06: 6.25 mg via INTRAVENOUS

## 2019-10-06 MED ORDER — ALUM & MAG HYDROXIDE-SIMETH 200-200-20 MG/5ML PO SUSP: 30.0000 mL | ORAL | Status: DC | PRN

## 2019-10-06 MED ORDER — OXYCODONE HCL 5 MG PO TABS
5.0000 mg | ORAL_TABLET | ORAL | Status: DC | PRN
  Administered 2019-10-06: 5 mg via ORAL
  Filled 2019-10-06: qty 2
  Filled 2019-10-06: qty 1

## 2019-10-06 MED ORDER — HYDROMORPHONE HCL 1 MG/ML IJ SOLN
0.5000 mg | INTRAMUSCULAR | Status: DC | PRN
  Administered 2019-10-06 (×2): 1 mg via INTRAVENOUS
  Filled 2019-10-06 (×2): qty 1

## 2019-10-06 MED ORDER — MENTHOL 3 MG MT LOZG: 1.0000 | LOZENGE | OROMUCOSAL | Status: DC | PRN

## 2019-10-06 MED ORDER — ONDANSETRON HCL 4 MG PO TABS: 4.0000 mg | ORAL_TABLET | Freq: Four times a day (QID) | ORAL | Status: DC | PRN

## 2019-10-06 SURGICAL SUPPLY — 42 items
BAG ZIPLOCK 12X15 (MISCELLANEOUS) IMPLANT
BALL HIP CERAMIC (Hips) ×1 IMPLANT
BENZOIN TINCTURE PRP APPL 2/3 (GAUZE/BANDAGES/DRESSINGS) IMPLANT
BLADE SAW SGTL 18X1.27X75 (BLADE) ×2 IMPLANT
BLADE SAW SGTL 18X1.27X75MM (BLADE) ×1
CLOSURE WOUND 1/2 X4 (GAUZE/BANDAGES/DRESSINGS)
COVER PERINEAL POST (MISCELLANEOUS) ×3 IMPLANT
COVER SURGICAL LIGHT HANDLE (MISCELLANEOUS) ×3 IMPLANT
COVER WAND RF STERILE (DRAPES) ×3 IMPLANT
CUP SECTOR GRIPTON 50MM (Cup) ×3 IMPLANT
DRAPE STERI IOBAN 125X83 (DRAPES) ×3 IMPLANT
DRAPE U-SHAPE 47X51 STRL (DRAPES) ×6 IMPLANT
DRSG AQUACEL AG ADV 3.5X10 (GAUZE/BANDAGES/DRESSINGS) ×3 IMPLANT
DURAPREP 26ML APPLICATOR (WOUND CARE) ×3 IMPLANT
ELECT REM PT RETURN 15FT ADLT (MISCELLANEOUS) ×3 IMPLANT
GAUZE XEROFORM 1X8 LF (GAUZE/BANDAGES/DRESSINGS) ×3 IMPLANT
GLOVE BIO SURGEON STRL SZ7.5 (GLOVE) ×3 IMPLANT
GLOVE BIOGEL PI IND STRL 8 (GLOVE) ×2 IMPLANT
GLOVE BIOGEL PI INDICATOR 8 (GLOVE) ×4
GLOVE ECLIPSE 8.0 STRL XLNG CF (GLOVE) ×3 IMPLANT
GOWN STRL REUS W/TWL XL LVL3 (GOWN DISPOSABLE) ×6 IMPLANT
HANDPIECE INTERPULSE COAX TIP (DISPOSABLE) ×2
HIP BALL CERAMIC (Hips) ×3 IMPLANT
HOLDER FOLEY CATH W/STRAP (MISCELLANEOUS) ×3 IMPLANT
KIT TURNOVER KIT A (KITS) IMPLANT
LINER ACET PNNCL PLUS4 NEUTRAL (Hips) ×1 IMPLANT
PACK ANTERIOR HIP CUSTOM (KITS) ×3 IMPLANT
PENCIL SMOKE EVACUATOR (MISCELLANEOUS) IMPLANT
PINNACLE PLUS 4 NEUTRAL (Hips) ×3 IMPLANT
SET HNDPC FAN SPRY TIP SCT (DISPOSABLE) ×1 IMPLANT
STAPLER VISISTAT 35W (STAPLE) IMPLANT
STEM CORAIL KLA10 (Stem) ×3 IMPLANT
STRIP CLOSURE SKIN 1/2X4 (GAUZE/BANDAGES/DRESSINGS) IMPLANT
SUT ETHIBOND NAB CT1 #1 30IN (SUTURE) ×3 IMPLANT
SUT ETHILON 2 0 PS N (SUTURE) IMPLANT
SUT MNCRL AB 4-0 PS2 18 (SUTURE) IMPLANT
SUT VIC AB 0 CT1 36 (SUTURE) ×3 IMPLANT
SUT VIC AB 1 CT1 36 (SUTURE) ×3 IMPLANT
SUT VIC AB 2-0 CT1 27 (SUTURE) ×4
SUT VIC AB 2-0 CT1 TAPERPNT 27 (SUTURE) ×2 IMPLANT
TRAY FOLEY MTR SLVR 16FR STAT (SET/KITS/TRAYS/PACK) IMPLANT
YANKAUER SUCT BULB TIP 10FT TU (MISCELLANEOUS) ×3 IMPLANT

## 2019-10-06 NOTE — Anesthesia Postprocedure Evaluation (Signed)
Anesthesia Post Note  Patient: Monica Blanchard  Procedure(s) Performed: LEFT TOTAL HIP ARTHROPLASTY ANTERIOR APPROACH (Left Hip)     Patient location during evaluation: PACU Anesthesia Type: Spinal Level of consciousness: awake and alert Pain management: pain level controlled Vital Signs Assessment: post-procedure vital signs reviewed and stable Respiratory status: spontaneous breathing, nonlabored ventilation and respiratory function stable Cardiovascular status: blood pressure returned to baseline and stable Postop Assessment: no apparent nausea or vomiting Anesthetic complications: no   No complications documented.  Last Vitals:  Vitals:   10/06/19 1400 10/06/19 1415  BP: 123/80 111/77  Pulse: (!) 50 (!) 55  Resp: 16 19  Temp: 36.6 C   SpO2: 100% 100%    Last Pain:  Vitals:   10/06/19 1415  TempSrc:   PainSc: 0-No pain                 Lowella Curb

## 2019-10-06 NOTE — Interval H&P Note (Signed)
History and Physical Interval Note: The patient understands fully that she is here today for a left total hip arthroplasty due to her severe left hip avascular necrosis. There is been no acute change in medical status. See recent H&P. The risk and benefits of surgery were discussed in detail and informed consent is obtained. The left hip is been marked.  10/06/2019 9:53 AM  Monica Blanchard  has presented today for surgery, with the diagnosis of Left Hip Avascular Necrosis with Femoral Head Collapse.  The various methods of treatment have been discussed with the patient and family. After consideration of risks, benefits and other options for treatment, the patient has consented to  Procedure(s): LEFT TOTAL HIP ARTHROPLASTY ANTERIOR APPROACH (Left) as a surgical intervention.  The patient's history has been reviewed, patient examined, no change in status, stable for surgery.  I have reviewed the patient's chart and labs.  Questions were answered to the patient's satisfaction.     Kathryne Hitch

## 2019-10-06 NOTE — Op Note (Signed)
NAMESHERIANN, NEWMANN MEDICAL RECORD [id number] ACCOUNT [id number] DATE OF BIRTH:05-01-1975 FACILITY: [facility] LOCATION: [facility] PHYSICIAN:Dalbert Stillings Aretha Parrot, MD  OPERATIVE REPORT  DATE OF PROCEDURE:  10/06/2019  PREOPERATIVE DIAGNOSIS:  Left hip avascular necrosis with impending femoral head collapse.  POSTOPERATIVE DIAGNOSIS:  Left hip avascular necrosis with impending femoral head collapse.  PROCEDURE:  Left total hip arthroplasty through direct anterior approach.  IMPLANTS:  DePuy Sector Gription acetabular component size 50, size 32+4 neutral polyethylene liner, size 10 Corail femoral component with varus offset, size 32+5 ceramic hip ball.  SURGEON:  Vanita Panda. Magnus Ivan, MD  ASSISTANT:  Richardean Canal, PA-C  ANESTHESIA:  Spinal.  ANTIBIOTICS:  Two grams IV Ancef.  ESTIMATED BLOOD LOSS:  150-200 mL.  COMPLICATIONS:  None.  INDICATIONS:  The patient is a 44 year old female with known avascular necrosis of her left hip verified radiographically.  There is a pending femoral head collapse as well.  Her pain is daily and severe.  It has come on abruptly over this last year of  uncertain etiology why she developed AVN.  She is ambulating with a crutch and has now been significantly immobile.  We have recommended an urgent total hip arthroplasty through a direct anterior approach.  We had a long and thorough discussion about the  risk of acute blood loss anemia, nerve or vessel injury, fracture, infection, dislocation, DVT and implant failure.  We talked about the goals being decreased pain, improved mobility and overall improve quality of life.  DESCRIPTION OF PROCEDURE:  After informed consent was obtained and appropriate left hip was marked she was brought to the operating room and sat up on a stretcher where spinal anesthesia was then obtained.  She was laid in supine position on a stretcher.   Foley catheter was placed and both feet had traction boots applied to them.   Next, she was placed supine on the Hana fracture table, the perineal post in place and both legs in line skeletal traction device and no traction applied.  Her left operative  hip was prepped and draped with DuraPrep and sterile drapes.  A timeout was called and she was identified as correct patient, correct left hip.  We then made an incision just inferior and posterior to the anterior superior iliac spine and carried this  obliquely down the leg.  We dissected down tensor fascia lata muscle.  Tensor fascia was then divided longitudinally to proceed with direct anterior approach to the hip.  We identified and cauterized circumflex vessels and identified the hip capsule,  opened the hip capsule in an L-type format finding a large joint effusion.  We placed curved retractors around the medial and lateral femoral neck and made our femoral neck cut with an oscillating saw just proximal to the lesser trochanter and completed  this with an osteotome.  We placed a corkscrew guide in the femoral head and removed the femoral head in its entirety.  We found fibrillated cartilage and a flap of cartilage coming off consistent with avascular necrosis.  We then placed a bent Hohmann  over the medial acetabular rim and removed remnants of the acetabular labrum and other debris.  I then began reaming under direct visualization in stepwise increments from a size 44 reamer, going up to a size 49.  With all reamers under direct  visualization, the last reamer was placed under direct fluoroscopy, so we could obtain our depth of reaming our inclination and anteversion.  I then placed the real DePuy Sector Gription acetabular component  size 50 and we went with a 32+4 polyethylene  liner given her offset.  Attention was then turned to the femur.  With the leg externally rotated to 120 degrees, extended and adducted, we are able to place a Mueller retractor medially and Hohman retractor behind the greater trochanter, released   lateral joint capsule and used a box-cutting osteotome to enter the femoral canal and a rongeur to lateralize.  I then began broaching using the Corail broaching system going from a size 8 up to a size 10.  With a size 10 in place, we tried a varus  offset femoral neck and a 32+1 hip ball, reduced this in the acetabulum and we felt like we needed just a little bit more leg length and offset.  We dislocated the hip and removed the trial components.  We placed the real Corail femoral component size 10  with varus offset and we went with a 32+5 ceramic hip ball.  Again reduced this in the acetabulum and we assessed radiographically and mechanically.  We thought it was stable and we were pleased with leg length and offset.  We then irrigated the soft  tissue with normal saline solution using pulsatile lavage.  We closed the joint capsule with interrupted #1 Ethibond suture, followed by the tensor fascia was closed with #1 Vicryl.  0 Vicryl was used to close deep tissue and 2-0 Vicryl was used to close  subcutaneous tissue and interrupted staples were used to reapproximate the skin.  Xeroform and Aquacel dressing was applied.  She was taken off the Hana table and taken to recovery room in stable condition.  All final counts were correct.  There were no  complications noted.  Note Rexene Edison, PA-C, assisted in the entire case.  Her assistance was crucial for facilitating all aspects of this case.  CN/NUANCE  D:10/06/2019 T:10/06/2019 JOB:[id number]

## 2019-10-06 NOTE — Telephone Encounter (Signed)
They were just confirming surgery date

## 2019-10-06 NOTE — Brief Op Note (Signed)
10/06/2019  12:51 PM  PATIENT:  Monica Blanchard  44 y.o. female  PRE-OPERATIVE DIAGNOSIS:  Left Hip Avascular Necrosis with Femoral Head Collapse  POST-OPERATIVE DIAGNOSIS:  Left Hip Avascular Necrosis with Femoral Head Collapse  PROCEDURE:  Procedure(s): LEFT TOTAL HIP ARTHROPLASTY ANTERIOR APPROACH (Left)  SURGEON:  Surgeon(s) and Role:    Kathryne Hitch, MD - Primary  PHYSICIAN ASSISTANT:  Rexene Edison, PA-C  ANESTHESIA:   spinal  EBL:  150 mL   COUNTS:  YES  DICTATION: .Other Dictation: Dictation Number [id number]  PLAN OF CARE: Admit for overnight observation  PATIENT DISPOSITION:  PACU - hemodynamically stable.   Delay start of Pharmacological VTE agent (>24hrs) due to surgical blood loss or risk of bleeding: no

## 2019-10-06 NOTE — Transfer of Care (Signed)
Immediate Anesthesia Transfer of Care Note  Patient: Monica Blanchard  Procedure(s) Performed: LEFT TOTAL HIP ARTHROPLASTY ANTERIOR APPROACH (Left Hip)  Patient Location: PACU  Anesthesia Type:MAC and Spinal  Level of Consciousness: awake, alert , oriented and patient cooperative  Airway & Oxygen Therapy: Patient Spontanous Breathing and Patient connected to face mask oxygen  Post-op Assessment: Report given to RN and Post -op Vital signs reviewed and stable  Post vital signs: Reviewed and stable  Last Vitals:  Vitals Value Taken Time  BP 121/74 10/06/19 1310  Temp    Pulse 72 10/06/19 1312  Resp 15 10/06/19 1312  SpO2 100 % 10/06/19 1312  Vitals shown include unvalidated device data.  Last Pain:  Vitals:   10/06/19 0847  TempSrc:   PainSc: 7       Patients Stated Pain Goal: 3 (45/85/92 9244)  Complications: No complications documented.

## 2019-10-06 NOTE — Evaluation (Signed)
Physical Therapy Evaluation Patient Details Name: Monica Blanchard MRN: [id number] DOB: 02-Oct-1975 Today's Date: 10/06/2019   History of Present Illness  Pt s/p L THR 2* AVN.  Pt with hx of anemia  Clinical Impression  Pt s/p L THR and presents with decreased L LE strength/ROM and post op pain limiting functional mobility.  Pt should progress to dc home with family assist.    Follow Up Recommendations Home health PT;Follow surgeon's recommendation for DC plan and follow-up therapies    Equipment Recommendations  Rolling walker with 5" wheels;3in1 (PT)    Recommendations for Other Services       Precautions / Restrictions Precautions Precautions: Fall Restrictions Weight Bearing Restrictions: No Other Position/Activity Restrictions: WBAT      Mobility  Bed Mobility Overal bed mobility: Needs Assistance Bed Mobility: Supine to Sit     Supine to sit: Min assist     General bed mobility comments: cues for sequence and use of R LE to self assist  Transfers Overall transfer level: Needs assistance Equipment used: Rolling walker (2 wheeled) Transfers: Sit to/from Stand Sit to Stand: Min assist         General transfer comment: cues for LE management and use of UEs to self assist  Ambulation/Gait Ambulation/Gait assistance: Min assist Gait Distance (Feet): 4 Feet Assistive device: Rolling walker (2 wheeled) Gait Pattern/deviations: Step-to pattern;Decreased step length - right;Decreased step length - left;Shuffle;Trunk flexed Gait velocity: decr   General Gait Details: cues for sequence, posture, and position from [employer]            Wheelchair Mobility    Modified Rankin (Stroke Patients Only)       Balance Overall balance assessment: Mild deficits observed, not formally tested                                           Pertinent Vitals/Pain Pain Assessment: 0-10 Pain Score: 8  Pain Location: L hip Pain Descriptors /  Indicators: Aching;Sore;Grimacing;Guarding Pain Intervention(s): Limited activity within patient's tolerance;Monitored during session;Premedicated before session;Ice applied    Home Living Family/patient expects to be discharged to:: Private residence Living Arrangements: Spouse/significant other;Other relatives Available Help at Discharge: Family Type of Home: House Home Access: Level entry     Home Layout: One level Home Equipment: Cane - single point;Crutches      Prior Function Level of Independence: Independent;Independent with assistive device(s)               Hand Dominance        Extremity/Trunk Assessment   Upper Extremity Assessment Upper Extremity Assessment: Overall WFL for tasks assessed    Lower Extremity Assessment Lower Extremity Assessment: LLE deficits/detail LLE: Unable to fully assess due to pain    Cervical / Trunk Assessment Cervical / Trunk Assessment: Normal  Communication   Communication: No difficulties  Cognition Arousal/Alertness: Awake/alert Behavior During Therapy: WFL for tasks assessed/performed Overall Cognitive Status: Within Functional Limits for tasks assessed                                        General Comments      Exercises Total Joint Exercises Ankle Circles/Pumps: AROM;Both;10 reps;Supine   Assessment/Plan    PT Assessment Patient needs continued PT services  PT Problem List Decreased strength;Decreased  range of motion;Decreased activity tolerance;Decreased balance;Decreased mobility;Decreased knowledge of use of DME;Pain;Obesity       PT Treatment Interventions DME instruction;Gait training;Functional mobility training;Therapeutic exercise;Therapeutic activities;Balance training;Patient/family education    PT Goals (Current goals can be found in the Care Plan section)  Acute Rehab PT Goals Patient Stated Goal: Regain IND PT Goal Formulation: With patient Time For Goal Achievement:  10/20/19 Potential to Achieve Goals: Good    Frequency 7X/week   Barriers to discharge        Co-evaluation               AM-PAC PT "6 Clicks" Mobility  Outcome Measure Help needed turning from your back to your side while in a flat bed without using bedrails?: A Little Help needed moving from lying on your back to sitting on the side of a flat bed without using bedrails?: A Little Help needed moving to and from a bed to a chair (including a wheelchair)?: A Little Help needed standing up from a chair using your arms (e.g., wheelchair or bedside chair)?: A Little Help needed to walk in hospital room?: A Lot Help needed climbing 3-5 steps with a railing? : A Lot 6 Click Score: 16    End of Session Equipment Utilized During Treatment: Gait belt Activity Tolerance: Patient limited by fatigue;Patient limited by pain Patient left: in chair;with call bell/phone within reach;with chair alarm set;with family/visitor present Nurse Communication: Mobility status PT Visit Diagnosis: Difficulty in walking, not elsewhere classified (R26.2)    Time: 5643-3295 PT Time Calculation (min) (ACUTE ONLY): 21 min   Charges:   PT Evaluation $PT Eval Low Complexity: 1 Low          Mauro Kaufmann PT Acute Rehabilitation Services Pager [phone] Office [phone]   Suprena Travaglini 10/06/2019, 5:56 PM

## 2019-10-06 NOTE — Anesthesia Procedure Notes (Signed)
Spinal  Start time: 10/06/2019 11:28 AM End time: 10/06/2019 11:32 AM Staffing Resident/CRNA: Victoriano Lain, CRNA Preanesthetic Checklist Completed: patient identified, IV checked, site marked, risks and benefits discussed, surgical consent, monitors and equipment checked, pre-op evaluation and timeout performed Spinal Block Patient position: sitting Prep: DuraPrep and site prepped and draped Patient monitoring: heart rate, continuous pulse ox and blood pressure Approach: midline Location: L3-4 Injection technique: single-shot Needle Needle type: Pencan  Needle gauge: 24 G Needle length: 10 cm Assessment Sensory level: T4 Additional Notes Pt placed in sitting position for spinal placement. Spinal kit expiration date checked and verified. One attempt. + clear CSF obtained. - heme. Pt tolerated well.

## 2019-10-07 DIAGNOSIS — M87052 Idiopathic aseptic necrosis of left femur: Secondary | ICD-10-CM | POA: Diagnosis not present

## 2019-10-07 LAB — BASIC METABOLIC PANEL
Anion gap: 12 (ref 5–15)
BUN: 15 mg/dL (ref 6–20)
CO2: 22 mmol/L (ref 22–32)
Calcium: 9.5 mg/dL (ref 8.9–10.3)
Chloride: 102 mmol/L (ref 98–111)
Creatinine, Ser: 0.83 mg/dL (ref 0.44–1.00)
GFR calc Af Amer: >60 mL/min (ref >60)
GFR calc non Af Amer: >60 mL/min (ref >60)
Glucose, Bld: 263 mg/dL — ABNORMAL HIGH (ref 70–99)
Potassium: 4.3 mmol/L (ref 3.5–5.1)
Sodium: 136 mmol/L (ref 135–145)

## 2019-10-07 LAB — CBC
HCT: 38.4 % (ref 36.0–46.0)
Hemoglobin: 11.9 g/dL — ABNORMAL LOW (ref 12.0–15.0)
MCH: 27.2 pg (ref 26.0–34.0)
MCHC: 31 g/dL (ref 30.0–36.0)
MCV: 87.7 fL (ref 80.0–100.0)
Platelets: 359 10*3/uL (ref 150–400)
RBC: 4.38 MIL/uL (ref 3.87–5.11)
RDW: 14 % (ref 11.5–15.5)
WBC: 19.8 10*3/uL — ABNORMAL HIGH (ref 4.0–10.5)
nRBC: 0 % (ref 0.0–0.2)

## 2019-10-07 MED ORDER — GABAPENTIN 100 MG PO CAPS: 100.0000 mg | ORAL_CAPSULE | Freq: Three times a day (TID) | ORAL | 0 refills | Status: DC

## 2019-10-07 MED ORDER — OXYCODONE HCL 5 MG PO TABS: 5.0000 mg | ORAL_TABLET | ORAL | 0 refills | Status: DC | PRN

## 2019-10-07 MED ORDER — METHOCARBAMOL 500 MG PO TABS: 500.0000 mg | ORAL_TABLET | Freq: Four times a day (QID) | ORAL | 1 refills | Status: DC | PRN

## 2019-10-07 MED ORDER — ASPIRIN 81 MG PO CHEW: 81.0000 mg | CHEWABLE_TABLET | Freq: Two times a day (BID) | ORAL | 0 refills | Status: DC

## 2019-10-07 NOTE — Plan of Care (Signed)
°  Problem: Education: Goal: Knowledge of the prescribed therapeutic regimen will improve 10/07/2019 1826 by Iantha Fallen, RN Outcome: Adequate for Discharge 10/07/2019 1135 by Iantha Fallen, RN Outcome: Progressing Goal: Understanding of discharge needs will improve 10/07/2019 1826 by Iantha Fallen, RN Outcome: Adequate for Discharge 10/07/2019 1135 by Iantha Fallen, RN Outcome: Progressing Goal: Individualized Educational Video(s) 10/07/2019 1826 by Iantha Fallen, RN Outcome: Adequate for Discharge 10/07/2019 1135 by Iantha Fallen, RN Outcome: Progressing   Problem: Activity: Goal: Ability to avoid complications of mobility impairment will improve 10/07/2019 1826 by Iantha Fallen, RN Outcome: Adequate for Discharge 10/07/2019 1135 by Iantha Fallen, RN Outcome: Progressing Goal: Ability to tolerate increased activity will improve 10/07/2019 1826 by Iantha Fallen, RN Outcome: Adequate for Discharge 10/07/2019 1135 by Iantha Fallen, RN Outcome: Progressing   Problem: Clinical Measurements: Goal: Postoperative complications will be avoided or minimized 10/07/2019 1826 by Iantha Fallen, RN Outcome: Adequate for Discharge 10/07/2019 1135 by Iantha Fallen, RN Outcome: Progressing   Problem: Pain Management: Goal: Pain level will decrease with appropriate interventions 10/07/2019 1826 by Iantha Fallen, RN Outcome: Adequate for Discharge 10/07/2019 1135 by Iantha Fallen, RN Outcome: Progressing   Problem: Skin Integrity: Goal: Will show signs of wound healing Outcome: Adequate for Discharge

## 2019-10-07 NOTE — Progress Notes (Signed)
   10/07/19 1200  PT Visit Information  Last PT Received On 10/07/19  Exercises focused session. Reviewed THA HEP, pt able to return demo using gait belt as leg lifter to self assist prn. Ready for d/c from PT standpoint   Assistance Needed +1  History of Present Illness Pt s/p L THR 2* AVN.  Pt with hx of anemia  Subjective Data  Patient Stated Goal Regain IND  Precautions  Precautions Fall  Restrictions  Weight Bearing Restrictions No  Other Position/Activity Restrictions WBAT  Pain Assessment  Pain Assessment 0-10  Pain Score 4  Pain Location L hip  Pain Descriptors / Indicators Aching;Sore;Grimacing;Guarding  Pain Intervention(s) Limited activity within patient's tolerance;Monitored during session;Premedicated before session;Repositioned;Ice applied  Cognition  Arousal/Alertness Awake/alert  Behavior During Therapy WFL for tasks assessed/performed  Overall Cognitive Status Within Functional Limits for tasks assessed  Ambulation/Gait  Ambulation/Gait assistance Min guard;Supervision  Assistive device Rolling walker (2 wheeled)  Gait Pattern/deviations Step-to pattern;Decreased step length - right;Decreased step length - left;Shuffle;Trunk flexed  General Gait Details cues for sequence, posture, and position from RW  Gait velocity decr  Balance  Overall balance assessment Mild deficits observed, not formally tested  Exercises  Exercises Total Joint  Total Joint Exercises  Ankle Circles/Pumps AROM;Both;10 reps;Supine  Quad Sets AROM;Both;10 reps  Short Arc [employer];Left;10 reps  Heel Slides AAROM;Left;10 reps  Hip ABduction/ADduction AROM;AAROM;Left;10 reps  PT - End of Session  Equipment Utilized During Treatment Gait belt  Activity Tolerance Patient tolerated treatment well  Patient left in chair;with call bell/phone within reach;with chair alarm set  Nurse Communication Mobility status   PT - Assessment/Plan  PT Visit Diagnosis Difficulty in walking, not  elsewhere classified (R26.2)  PT Frequency (ACUTE ONLY) 7X/week  Follow Up Recommendations Home health PT;Follow surgeon's recommendation for DC plan and follow-up therapies  PT equipment Rolling walker with 5" wheels;3in1 (PT)  AM-PAC PT "6 Clicks" Mobility Outcome Measure (Version 2)  Help needed turning from your back to your side while in a flat bed without using bedrails? 3  Help needed moving from lying on your back to sitting on the side of a flat bed without using bedrails? 3  Help needed moving to and from a bed to a chair (including a wheelchair)? 3  Help needed standing up from a chair using your arms (e.g., wheelchair or bedside chair)? 3  Help needed to walk in hospital room? 3  Help needed climbing 3-5 steps with a railing?  3  6 Click Score 18  Consider Recommendation of Discharge To: [facility] with [facility]  PT Goal Progression  Progress towards PT goals Progressing toward goals  Acute Rehab PT Goals  PT Goal Formulation With patient  Time For Goal Achievement 10/20/19  Potential to Achieve Goals Good  PT Time Calculation  PT Start Time (ACUTE ONLY) 1202  PT Stop Time (ACUTE ONLY) 1219  PT Time Calculation (min) (ACUTE ONLY) 17 min  PT Treatments  $Therapeutic Exercise 8-22 mins

## 2019-10-07 NOTE — Plan of Care (Signed)
  Problem: Education: Goal: Knowledge of the prescribed therapeutic regimen will improve Outcome: Progressing Goal: Understanding of discharge needs will improve Outcome: Progressing Goal: Individualized Educational Video(s) Outcome: Progressing   Problem: Activity: Goal: Ability to avoid complications of mobility impairment will improve Outcome: Progressing Goal: Ability to tolerate increased activity will improve Outcome: Progressing   Problem: Clinical Measurements: Goal: Postoperative complications will be avoided or minimized Outcome: Progressing   Problem: Pain Management: Goal: Pain level will decrease with appropriate interventions Outcome: Progressing   

## 2019-10-07 NOTE — Progress Notes (Signed)
   10/07/19 1400  PT Visit Information  Last PT Received On 10/07/19  Pt unable to receive [facility] services d/t issues with insurance. RN requesting PT to return for further pt education with regard to activity progression. Reviewed HEP, standing exercises, progression of HEP, length of RW use, progression to cane, importance of ice,  as well as not over doing it during the early phases of healing.   Assistance Needed +1  History of Present Illness Pt s/p L THR 2* AVN.  Pt with hx of anemia  Subjective Data  Patient Stated Goal Regain IND  Precautions  Precautions Fall  Restrictions  Weight Bearing Restrictions No  Other Position/Activity Restrictions WBAT  Pain Assessment  Pain Assessment 0-10  Pain Score 3  Pain Location L hip  Pain Descriptors / Indicators Aching;Sore;Grimacing;Guarding  Pain Intervention(s) Limited activity within patient's tolerance;Monitored during session  Cognition  Arousal/Alertness Awake/alert  Behavior During Therapy WFL for tasks assessed/performed  Overall Cognitive Status Within Functional Limits for tasks assessed  Transfers  Overall transfer level Needs assistance  Equipment used Rolling walker (2 wheeled)  Transfers Sit to/from [employer] Transfers  Sit to Stand Min guard;Supervision  Stand pivot transfers Min guard;Supervision  General transfer comment cues for LE management and use of UEs to self assist  Balance  Overall balance assessment Mild deficits observed, not formally tested  Exercises  Exercises Total Joint  Total Joint Exercises  Hip ABduction/ADduction AROM;5 reps;Left;Standing  Knee Flexion Left;5 reps;Standing  Marching in Standing 5 reps;Left;Standing  Standing Hip Extension AROM;Left;10 reps  PT - End of Session  Equipment Utilized During Treatment Gait belt  Activity Tolerance Patient tolerated treatment well  Patient left in chair;with call bell/phone within reach;with chair alarm set  Nurse Communication Mobility  status   PT - Assessment/Plan  PT Visit Diagnosis Difficulty in walking, not elsewhere classified (R26.2)  PT Frequency (ACUTE ONLY) 7X/week  Follow Up Recommendations Home health PT;Follow surgeon's recommendation for DC plan and follow-up therapies  PT equipment Rolling walker with 5" wheels;3in1 (PT)  AM-PAC PT "6 Clicks" Mobility Outcome Measure (Version 2)  Help needed turning from your back to your side while in a flat bed without using bedrails? 3  Help needed moving from lying on your back to sitting on the side of a flat bed without using bedrails? 3  Help needed moving to and from a bed to a chair (including a wheelchair)? 3  Help needed standing up from a chair using your arms (e.g., wheelchair or bedside chair)? 3  Help needed to walk in hospital room? 3  Help needed climbing 3-5 steps with a railing?  3  6 Click Score 18  Consider Recommendation of Discharge To: Home with [facility]  PT Goal Progression  Progress towards PT goals Progressing toward goals  Acute Rehab PT Goals  PT Goal Formulation With patient  Time For Goal Achievement 10/20/19  Potential to Achieve Goals Good  PT Time Calculation  PT Start Time (ACUTE ONLY) 1428  PT Stop Time (ACUTE ONLY) 1447  PT Time Calculation (min) (ACUTE ONLY) 19 min  PT General Charges  $$ ACUTE PT VISIT 1 Visit  PT Treatments  $Therapeutic Exercise 8-22 mins  Delice Bison, PT  Acute Rehab Dept ([facility]) [phone] Pager [phone]  10/07/2019

## 2019-10-07 NOTE — Progress Notes (Signed)
Physical Therapy Treatment Patient Details Name: Monica Blanchard MRN: [id number] DOB: 10-09-1975 Today's Date: 10/07/2019    History of Present Illness Pt s/p L THR 2* AVN.  Pt with hx of anemia    PT Comments    Pt progressing well. Will see again to review HEP, pt should be ready for d/c later today   Follow Up Recommendations  Home health PT;Follow surgeon's recommendation for DC plan and follow-up therapies     Equipment Recommendations  Rolling walker with 5" wheels;3in1 (PT)    Recommendations for Other Services       Precautions / Restrictions Precautions Precautions: Fall Restrictions Weight Bearing Restrictions: No Other Position/Activity Restrictions: WBAT    Mobility  Bed Mobility Overal bed mobility: Needs Assistance Bed Mobility: Supine to Sit     Supine to sit: Min assist     General bed mobility comments: cues for sequence and use of gait belt to self assist   Transfers Overall transfer level: Needs assistance Equipment used: Rolling walker (2 wheeled) Transfers: Sit to/from Stand Sit to Stand: Min guard;Supervision         General transfer comment: cues for LE management and use of UEs to self assist  Ambulation/Gait Ambulation/Gait assistance: Min guard;Supervision Gait Distance (Feet): 60 Feet Assistive device: Rolling walker (2 wheeled) Gait Pattern/deviations: Step-to pattern;Decreased step length - right;Decreased step length - left;Shuffle;Trunk flexed Gait velocity: decr   General Gait Details: cues for sequence, posture, and position from [employer]             Wheelchair Mobility    Modified Rankin (Stroke Patients Only)       Balance Overall balance assessment: Mild deficits observed, not formally tested                                          Cognition Arousal/Alertness: Awake/alert Behavior During Therapy: WFL for tasks assessed/performed Overall Cognitive Status: Within Functional  Limits for tasks assessed                                        Exercises Total Joint Exercises Ankle Circles/Pumps: AROM;Both;10 reps;Supine    General Comments        Pertinent Vitals/Pain Pain Assessment: 0-10 Pain Score: 7  Pain Location: L hip Pain Descriptors / Indicators: Aching;Sore;Grimacing;Guarding Pain Intervention(s): Limited activity within patient's tolerance;Monitored during session;Premedicated before session;Repositioned;Ice applied    Home Living                      Prior Function            PT Goals (current goals can now be found in the care plan section) Acute Rehab PT Goals Patient Stated Goal: Regain IND PT Goal Formulation: With patient Time For Goal Achievement: 10/20/19 Potential to Achieve Goals: Good Progress towards PT goals: Progressing toward goals    Frequency    7X/week      PT Plan      Co-evaluation              AM-PAC PT "6 Clicks" Mobility   Outcome Measure  Help needed turning from your back to your side while in a flat bed without using bedrails?: A Little Help needed moving from lying on your back to sitting  on the side of a flat bed without using bedrails?: A Little Help needed moving to and from a bed to a chair (including a wheelchair)?: A Little Help needed standing up from a chair using your arms (e.g., wheelchair or bedside chair)?: A Little Help needed to walk in hospital room?: A Lot Help needed climbing 3-5 steps with a railing? : A Lot 6 Click Score: 16    End of Session Equipment Utilized During Treatment: Gait belt Activity Tolerance: Patient limited by fatigue;Patient limited by pain Patient left: in chair;with call bell/phone within reach;with chair alarm set;with family/visitor present Nurse Communication: Mobility status PT Visit Diagnosis: Difficulty in walking, not elsewhere classified (R26.2)     Time: 6759-1638 PT Time Calculation (min) (ACUTE ONLY): 28  min  Charges:  $Gait Training: 23-37 mins                     Delice Bison, PT  Acute Rehab Dept ([facility]) [phone] Pager [phone]  10/07/2019    [facility] 10/07/2019, 10:34 AM

## 2019-10-07 NOTE — TOC Initial Note (Signed)
Transition of Care [facility]) - Initial/Assessment Note    Patient Details  Name: Monica Blanchard MRN: [id number] Date of Birth: 08-17-1975  Transition of Care (TOC) CM/SW Contact:    Armanda Heritage, RN Phone Number: 10/07/2019, 1:28 PM  Clinical Narrative:                 CM spoke with patient at bedside.  CM contacted all home health providers in the area and none are able to provide services. Results of search is the following: [facility]- declined [facility]- [facility]- [facility] and not staff availability [facility]- [facility] and no staff availability [facility]- [facility] in-network but unable to accept due to staffing   CM notified patient and MD about this.  Patient is understandably upset about the lack of HH services, however at this time CM has no HH options for this patient. CM spoke with MD regarding his office scheduling outpatient PT for patient once clinics re-open on Monday.  Based on information available to this CM, it would appear that the managed medicaid plan for this patient should be in network with [facility] outpatient PT clinics.  Patient also states she has received a list of exercises from the PT who saw her today.  [employer] to deliver rolling walker and 3in1 to bedside for home use.   Expected Discharge Plan: Home w Home Health Services Barriers to Discharge: No Home Care Agency will accept this patient   Patient Goals and CMS Choice Patient states their goals for this hospitalization and ongoing recovery are:: patient wants to go home CMS Medicare.gov Compare Post Acute Care list provided to:: Patient Choice offered to / list presented to : Patient  Expected Discharge Plan and Services Expected Discharge Plan: Home w Home Health Services   Discharge Planning Services: CM Consult Post Acute Care Choice: Home Health Living arrangements for the past 2 months: Single Family Home Expected Discharge Date: 10/07/19               DME Arranged: Dan Humphreys rolling,  3-N-1 DME Agency: [employer] Date DME Agency Contacted: 10/07/19 Time DME Agency Contacted: [phone] Representative spoke with at DME Agency: [facility] HH Arranged:  (unable to find any agency to accept patient for [facility])          Prior Living Arrangements/Services Living arrangements for the past 2 months: Single Family Home   Patient language and need for interpreter reviewed:: Yes Do you feel safe going back to the place where you live?: Yes      Need for Family Participation in Patient Care: Yes (Comment) Care giver support system in place?: Yes (comment)   Criminal Activity/Legal Involvement Pertinent to Current Situation/Hospitalization: No - Comment as needed  Activities of Daily Living Home Assistive Devices/Equipment: Crutches, Cane (specify quad or straight) ADL Screening (condition at time of admission) Patient's cognitive ability adequate to safely complete daily activities?: Yes Is the patient deaf or have difficulty hearing?: No Does the patient have difficulty seeing, even when wearing glasses/contacts?: No Does the patient have difficulty concentrating, remembering, or making decisions?: No Patient able to express need for assistance with ADLs?: Yes Does the patient have difficulty dressing or bathing?: Yes Independently performs ADLs?: Yes (appropriate for developmental age) Does the patient have difficulty walking or climbing stairs?: Yes Weakness of Legs: Left Weakness of Arms/Hands: None  Permission Sought/Granted                  Emotional Assessment Appearance:: Appears stated age Attitude/Demeanor/Rapport:  Engaged Affect (typically observed): Accepting Orientation: : Oriented to Self, Oriented to Place, Oriented to  Time, Oriented to Situation   Psych Involvement: No (comment)  Admission diagnosis:  Status post total replacement of left hip [Z96.642] Patient Active Problem List   Diagnosis Date Noted  . Status post total replacement of left hip  10/06/2019  . Avascular necrosis of bone of left hip (HCC) 09/19/2019   PCP:  Malka So., MD Pharmacy:   [facility] [phone] - [place], [place] - [address] AT [facility] OF [address] [place] [address] Phone: [phone] Fax: [phone]  [facility] #[id number] - [place], [place] - [address] AT [facility] [address] [place] [address] Phone: [phone] Fax: [phone]     Social Determinants of Health (SDOH) Interventions    Readmission Risk Interventions No flowsheet data found.

## 2019-10-07 NOTE — Discharge Summary (Signed)
Patient ID: Monica Blanchard MRN: [id number] DOB/AGE: 04-10-1975 44 y.o.  Admit date: 10/06/2019 Discharge date: 10/07/2019  Admission Diagnoses:  Principal Problem:   Avascular necrosis of bone of left hip (HCC) Active Problems:   Status post total replacement of left hip   Discharge Diagnoses:  Same  Past Medical History:  Diagnosis Date  . Anemia   . Arthritis   . Ectopic pregnancy    x 2  . Hypertension     Surgeries: Procedure(s): LEFT TOTAL HIP ARTHROPLASTY ANTERIOR APPROACH on 10/06/2019   Consultants:   Discharged Condition: Improved  Hospital Course: Monica Blanchard is an 44 y.o. female who was admitted 10/06/2019 for operative treatment ofAvascular necrosis of bone of left hip (HCC). Patient has severe unremitting pain that affects sleep, daily activities, and work/hobbies. After pre-op clearance the patient was taken to the operating room on 10/06/2019 and underwent  Procedure(s): LEFT TOTAL HIP ARTHROPLASTY ANTERIOR APPROACH.    Patient was given perioperative antibiotics:  Anti-infectives (From admission, onward)   Start     Dose/Rate Route Frequency Ordered Stop   10/06/19 1800  ceFAZolin (ANCEF) IVPB 1 g/50 mL premix        1 g 100 mL/hr over 30 Minutes Intravenous Every 6 hours 10/06/19 1513 10/07/19 0033   10/06/19 0830  ceFAZolin (ANCEF) IVPB 2g/100 mL premix        2 g 200 mL/hr over 30 Minutes Intravenous On call to O.R. 10/06/19 0819 10/06/19 1203   10/06/19 0600  vancomycin (VANCOREADY) IVPB 1500 mg/300 mL        1,500 mg 150 mL/hr over 120 Minutes Intravenous 30 min pre-op 10/05/19 0758 10/06/19 1156       Patient was given sequential compression devices, early ambulation, and chemoprophylaxis to prevent DVT.  Patient benefited maximally from hospital stay and there were no complications.    Recent vital signs:  Patient Vitals for the past 24 hrs:  BP Temp Temp src Pulse Resp SpO2 Height Weight  10/07/19 0504 (!) 155/94 98.9 F (37.2 C) -- 70 17  100 % -- --  10/07/19 0157 (!) 172/90 98.9 F (37.2 C) -- 76 16 100 % -- --  10/06/19 2121 (!) 152/93 97.7 F (36.5 C) -- 79 18 100 % -- --  10/06/19 1828 139/71 98.5 F (36.9 C) Oral 76 18 100 % -- --  10/06/19 1742 (!) 138/96 98.2 F (36.8 C) -- 70 18 100 % -- --  10/06/19 1637 139/87 98.2 F (36.8 C) Oral 72 18 99 % -- --  10/06/19 1517 129/82 97.9 F (36.6 C) Oral 64 15 100 % -- --  10/06/19 1500 (!) 135/92 97.6 F (36.4 C) -- 66 10 100 % -- --  10/06/19 1445 (!) 141/94 -- -- (!) 59 15 99 % -- --  10/06/19 1430 126/83 -- -- (!) 53 18 97 % -- --  10/06/19 1415 111/77 -- -- (!) 55 19 100 % -- --  10/06/19 1400 123/80 97.9 F (36.6 C) -- (!) 50 16 100 % -- --  10/06/19 1345 122/74 -- -- (!) 50 17 100 % -- --  10/06/19 1330 111/74 -- -- (!) 55 12 100 % -- --  10/06/19 1315 100/84 -- -- 70 13 100 % -- --  10/06/19 1310 121/74 (!) 97.5 F (36.4 C) -- 85 16 99 % -- --  10/06/19 0847 -- -- -- -- -- -- 5\' 9"  (1.753 m) 103.4 kg     Recent laboratory studies:  Recent  Labs    10/07/19 0307  WBC 19.8*  HGB 11.9*  HCT 38.4  PLT 359  NA 136  K 4.3  CL 102  CO2 22  BUN 15  CREATININE 0.83  GLUCOSE 263*  CALCIUM 9.5     Discharge Medications:   Allergies as of 10/07/2019   No Known Allergies     Medication List    STOP taking these medications   HYDROcodone-acetaminophen 5-325 MG tablet Commonly known as: NORCO/VICODIN   oxyCODONE-acetaminophen 5-325 MG tablet Commonly known as: PERCOCET/ROXICET     TAKE these medications   amLODipine 10 MG tablet Commonly known as: NORVASC Take 10 mg by mouth daily.   aspirin 81 MG chewable tablet Chew 1 tablet (81 mg total) by mouth 2 (two) times daily.   gabapentin 100 MG capsule Commonly known as: NEURONTIN Take 1 capsule (100 mg total) by mouth 3 (three) times daily.   hydrochlorothiazide 12.5 MG capsule Commonly known as: MICROZIDE Take 12.5 mg by mouth daily.   lisinopril 20 MG tablet Commonly known as:  ZESTRIL Take 20 mg by mouth daily.   meloxicam 15 MG tablet Commonly known as: MOBIC Take 1 tablet (15 mg total) by mouth daily.   methocarbamol 500 MG tablet Commonly known as: ROBAXIN Take 1 tablet (500 mg total) by mouth every 6 (six) hours as needed for muscle spasms.   oxyCODONE 5 MG immediate release tablet Commonly known as: Oxy IR/ROXICODONE Take 1-2 tablets (5-10 mg total) by mouth every 4 (four) hours as needed for moderate pain (pain score 4-6).            Durable Medical Equipment  (From admission, onward)         Start     Ordered   10/06/19 1514  DME 3 n 1  Once        10/06/19 1513   10/06/19 1514  DME Walker rolling  Once       Question Answer Comment  Walker: With 5 Inch Wheels   Patient needs a walker to treat with the following condition Status post total replacement of left hip      10/06/19 1513          Diagnostic Studies: DG Pelvis Portable  Result Date: 10/06/2019 CLINICAL DATA:  Status post left hip replacement. EXAM: PORTABLE PELVIS 1-2 VIEWS COMPARISON:  None. FINDINGS: The patient is status post left hip replacement. Acetabular and femoral components are in good position. No other acute abnormalities. IMPRESSION: Left hip replacement as above. Electronically Signed   By: Gerome Sam III M.D   On: 10/06/2019 14:45   CT Hip Left Wo Contrast  Addendum Date: 09/15/2019   ADDENDUM REPORT: 09/15/2019 23:02 ADDENDUM: Bones/Joint/Cartilage A mildly displaced fracture deformity is seen involving the left femoral head. IMPRESSION: 1. Mildly displaced fracture deformity involving the left femoral head. MRI correlation is recommended. Electronically Signed   By: Aram Candela M.D.   On: 09/15/2019 23:02   Result Date: 09/15/2019 CLINICAL DATA:  Evaluation of chronic hip pain. EXAM: CT OF THE LEFT HIP WITHOUT CONTRAST TECHNIQUE: Multidetector CT imaging of the left hip was performed according to the standard protocol. Multiplanar CT image  reconstructions were also generated. COMPARISON:  None. FINDINGS: Bones/Joint/Cartilage A mildly displaced fracture deformity is seen involving the left humeral head. Adjacent 1.0 cm 1.3 cm subcortical cysts are seen within this region. There is no evidence of dislocation. Mild to moderate severity degenerative changes are noted along the lateral aspect of the  left acetabulum. Ligaments Suboptimally assessed by CT. Muscles and Tendons The muscles and tendons are intact. Soft tissues Soft tissue structures are normal in appearance without evidence of significant soft tissue swelling or hematoma. IMPRESSION: 1. Mildly displaced fracture deformity involving the left humeral head. MRI correlation is recommended. 2. Mild to moderate severity degenerative changes along the lateral aspect of the left acetabulum. Electronically Signed: By: Aram Candela M.D. On: 09/15/2019 22:36   DG C-Arm 1-60 Min-No Report  Result Date: 10/06/2019 Fluoroscopy was utilized by the requesting physician.  No radiographic interpretation.   DG HIP OPERATIVE UNILAT W OR W/O PELVIS LEFT  Result Date: 10/06/2019 CLINICAL DATA:  Status post left hip replacement. FLUOROSCOPY TIME:  21 seconds EXAM: OPERATIVE LEFT HIP (WITH PELVIS IF PERFORMED) 3 VIEWS TECHNIQUE: Fluoroscopic spot image(s) were submitted for interpretation post-operatively. COMPARISON:  None. FINDINGS: Fluoroscopic images were obtained during left hip replacement. Acetabular and femoral components are in good position. No other acute abnormalities. IMPRESSION: Left hip replacement as above. Electronically Signed   By: Gerome Sam III M.D   On: 10/06/2019 14:45   DG Hip Unilat W or Wo Pelvis 2-3 Views Left  Result Date: 09/15/2019 CLINICAL DATA:  Hip pain, felt a pop EXAM: DG HIP (WITH OR WITHOUT PELVIS) 2-3V LEFT COMPARISON:  CT 03/14/2015, radiograph 08/25/2019 FINDINGS: Serpentine sclerosis and increasing lucency of the left femoral head are compatible with an  osteochondral lesion including a possible osteochondral fracture fragment of the superior femoral head there is associated bony remodeling of the left acetabulum and mild thickening about the left hip joint which could reflect some effusion and or synovitis. These features are somewhat progressive from the comparison study. Mild-to-moderate degenerative changes are present in the right hip. Bones of the pelvis are otherwise intact and congruent. Phleboliths in the pelvis. Remaining soft tissues are unremarkable. IMPRESSION: 1. 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 Features are progressive from the comparison study. Progressive bony remodeling of the left acetabulum and soft tissue thickening of the left hip, could suggest an effusion. Could consider further evaluation with MRI or CT. 2. Mild-to-moderate degenerative changes in the right hip. Electronically Signed   By: Kreg Shropshire M.D.   On: 09/15/2019 21:56    Disposition: Discharge disposition: 01-Home or Self Care          Follow-up Information    Kathryne Hitch, MD Follow up in 2 week(s).   Specialty: Orthopedic Surgery Contact information: [address] [place] [address] [phone]                Signed: Kathryne Hitch 10/07/2019, 8:46 AM

## 2019-10-07 NOTE — Progress Notes (Signed)
Subjective: 1 Day Post-Op Procedure(s) (LRB): LEFT TOTAL HIP ARTHROPLASTY ANTERIOR APPROACH (Left) Patient reports pain as moderate.    Objective: Vital signs in last 24 hours: Temp:  [97.5 F (36.4 C)-99.2 F (37.3 C)] 98.9 F (37.2 C) (07/10 0504) Pulse Rate:  [50-85] 70 (07/10 0504) Resp:  [10-19] 17 (07/10 0504) BP: (100-172)/(71-105) 155/94 (07/10 0504) SpO2:  [97 %-100 %] 100 % (07/10 0504) Weight:  [103.4 kg] 103.4 kg (07/09 0847)  Intake/Output from previous day: 07/09 0701 - 07/10 0700 In: 4599.3 [P.O.:360; I.V.:2924.3; IV Piggyback:1315] Out: 6250 [Urine:6100; Blood:150] Intake/Output this shift: Total I/O In: -  Out: 300 [Urine:300]  Recent Labs    10/07/19 0307  HGB 11.9*   Recent Labs    10/07/19 0307  WBC 19.8*  RBC 4.38  HCT 38.4  PLT 359   Recent Labs    10/07/19 0307  NA 136  K 4.3  CL 102  CO2 22  BUN 15  CREATININE 0.83  GLUCOSE 263*  CALCIUM 9.5   No results for input(s): LABPT, INR in the last 72 hours.  Sensation intact distally Intact pulses distally Dorsiflexion/Plantar flexion intact Incision: dressing C/D/I   Assessment/Plan: 1 Day Post-Op Procedure(s) (LRB): LEFT TOTAL HIP ARTHROPLASTY ANTERIOR APPROACH (Left) Up with therapy Discharge home with home health    Patient's anticipated LOS is less than 2 midnights, meeting these requirements: - Younger than 30 - Lives within 1 hour of care - Has a competent adult at home to recover with post-op recover - NO history of  - Chronic pain requiring opiods  - Diabetes  - Coronary Artery Disease  - Heart failure  - Heart attack  - Stroke  - DVT/VTE  - Cardiac arrhythmia  - Respiratory Failure/COPD  - Renal failure  - Anemia  - Advanced Liver disease       Kathryne Hitch 10/07/2019, 8:44 AM

## 2019-10-07 NOTE — Discharge Instructions (Signed)

## 2019-10-09 ENCOUNTER — Encounter (HOSPITAL_COMMUNITY): Payer: Self-pay | Admitting: Orthopaedic Surgery

## 2019-10-09 ENCOUNTER — Telehealth: Payer: Self-pay | Admitting: Orthopaedic Surgery

## 2019-10-09 NOTE — Telephone Encounter (Signed)
Patient called requesting a call back. Patient states Dr. Magnus Ivan was to send referral for inpatient physical therapy. Dr. Magnus Ivan or his nurse please call patient about this matter. Patient phone number is [phone].

## 2019-10-09 NOTE — Telephone Encounter (Signed)
Where did we end up getting her for HHPT?

## 2019-10-10 ENCOUNTER — Other Ambulatory Visit: Payer: Self-pay

## 2019-10-10 DIAGNOSIS — Z96642 Presence of left artificial hip joint: Secondary | ICD-10-CM

## 2019-10-12 ENCOUNTER — Ambulatory Visit: Payer: Medicaid Other | Attending: Orthopaedic Surgery | Admitting: Physical Therapy

## 2019-10-12 ENCOUNTER — Encounter: Payer: Self-pay | Admitting: Physical Therapy

## 2019-10-12 ENCOUNTER — Other Ambulatory Visit: Payer: Self-pay

## 2019-10-12 DIAGNOSIS — R262 Difficulty in walking, not elsewhere classified: Secondary | ICD-10-CM | POA: Diagnosis present

## 2019-10-12 DIAGNOSIS — M25652 Stiffness of left hip, not elsewhere classified: Secondary | ICD-10-CM | POA: Diagnosis present

## 2019-10-12 DIAGNOSIS — M25552 Pain in left hip: Secondary | ICD-10-CM | POA: Diagnosis not present

## 2019-10-12 DIAGNOSIS — M6281 Muscle weakness (generalized): Secondary | ICD-10-CM | POA: Insufficient documentation

## 2019-10-12 DIAGNOSIS — R2689 Other abnormalities of gait and mobility: Secondary | ICD-10-CM | POA: Diagnosis present

## 2019-10-12 NOTE — Therapy (Signed)
[facility] [address]  [address] [place], [place], [address] Phone: [phone]   Fax:  [phone]  Physical Therapy Evaluation  Patient Details  Name: Monica Blanchard MRN: [id number] Date of Birth: 1975/10/25 Referring Provider (PT): Doneen Poisson, MD   Encounter Date: 10/12/2019   PT End of Session - 10/12/19 1105    Visit Number 1    Number of Visits 13    Date for PT Re-Evaluation 11/23/19    Authorization Type Medicaid [employer]    PT Start Time 1105    PT Stop Time 1203    PT Time Calculation (min) 58 min    Activity Tolerance Patient tolerated treatment well    Behavior During Therapy [facility] for tasks assessed/performed           Past Medical History:  Diagnosis Date  . Anemia   . Arthritis   . Ectopic pregnancy    x 2  . Hypertension     Past Surgical History:  Procedure Laterality Date  . APPENDECTOMY    . CESAREAN SECTION    . ECTOPIC PREGNANCY SURGERY    . TOTAL HIP ARTHROPLASTY Left 10/06/2019   Procedure: LEFT TOTAL HIP ARTHROPLASTY ANTERIOR APPROACH;  Surgeon: Kathryne Hitch, MD;  Location: [facility];  Service: Orthopedics;  Laterality: Left;  . WRIST FRACTURE SURGERY      There were no vitals filed for this visit.    Subjective Assessment - 10/12/19 1110    Subjective Pt s/p THA on 10/06/19. Pain "tolerable" but worse at night. States his feel foreign and still feels like it is bone-on-bone. L leg feels heavy and tight especially proximally.    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 <5 minutes    How long can you stand comfortably? <5 minutes    How long can you walk comfortably? 1-3 minutes    Patient Stated Goals "to get back to some type of normalcy"    Currently in Pain? Yes    Pain Score 5    4-5/10, up to 8/10 at night   Pain Location Hip    Pain Orientation  Left    Pain Descriptors / Indicators Discomfort    Pain Type Surgical pain    Pain Radiating Towards down L LE to mid shin    Pain Onset In the past 7 days    Pain Frequency Constant    Aggravating Factors  laying in bed, being on feet for prolonged period    Pain Relieving Factors ice, pain meds    Effect of Pain on Daily Activities causes her to lean to R in stting and standing/walking              [facility] PT Assessment - 10/12/19 1105      Assessment   Medical Diagnosis L THA    Referring Provider (PT) Doneen Poisson, MD    Onset Date/Surgical Date 10/06/19    Hand Dominance Right    Next MD Visit 10/18/19    Prior Therapy none for current condition      Precautions   Precautions Anterior Hip      Restrictions   Weight Bearing Restrictions Yes    LLE Weight Bearing Weight bearing as tolerated      Balance Screen   Has the patient fallen in the past 6 months No  Has the patient had a decrease in activity level because of a fear of falling?  Yes    Is the patient reluctant to leave their home because of a fear of falling?  Yes      Home Environment   Living Environment Private residence    Living Arrangements Spouse/significant other;Children    Available Help at Discharge Family    Type of Home House    Home Access Ramped entrance    Home Layout One level    Home Equipment Walker - 2 wheels;Cane - single point;Crutches;Bedside commode      Prior Function   Level of Independence Independent    Vocation Full time employment   out of work due to surgery   Vocation Requirements CNA in SNF    Leisure mostly sedentary      Cognition   Overall Cognitive Status Within Functional Limits for tasks assessed      ROM / Strength   AROM / PROM / Strength AROM;Strength      Strength   Overall Strength Comments tested in sitting    Strength Assessment Site Hip;Knee;Ankle    Right/Left Hip Right;Left    Right Hip Flexion 4/5   pain in L hip   Right Hip ABduction  4+/5    Right Hip ADduction 4+/5    Left Hip Flexion 2/5    Left Hip ABduction 2-/5    Left Hip ADduction 2-/5    Right/Left Knee Right;Left    Right Knee Flexion 5/5    Right Knee Extension 5/5    Left Knee Flexion 4/5    Left Knee Extension 2/5    Right/Left Ankle Right;Left    Right Ankle Dorsiflexion 5/5    Left Ankle Dorsiflexion 4/5      Ambulation/Gait   Ambulation/Gait Yes    Ambulation/Gait Assistance 5: Supervision    Ambulation/Gait Assistance Details Adjusted height of RW to promote improved upright posture and provided cues to step into walker with decreased fwd lean on RW to promote normal gait pattern - pt noting walking feels better with improved posture and RW proximity    Ambulation Distance (Feet) 60 Feet    Assistive device Rolling walker    Gait Pattern Step-to pattern;Antalgic;Lateral trunk lean to right;Decreased weight shift to left;Decreased stance time - left;Decreased hip/knee flexion - left;Trunk flexed   L LE IR/toeing in   Ambulation Surface Level;Indoor    Gait velocity decreased                      Objective measurements completed on examination: See above findings.       [facility] Adult PT Treatment/Exercise - 10/12/19 1105      Exercises   Exercises Knee/Hip      Knee/Hip Exercises: Seated   Ball Squeeze Hip ADD isometric with pillow squeeze 5 x 5"    Abduction/Adduction  Left;AROM;5 reps      Knee/Hip Exercises: Supine   Quad Sets Left;10 reps   5" hold   Heel Slides Left;5 reps;AROM    Bridges Limitations unable to initiate lift    Straight Leg Raises Limitations unable to initiate lift    Other Supine Knee/Hip Exercises Glute sets & glute sets + PPT 5 x 5" each    Other Supine Knee/Hip Exercises L AAROM hip ABD/ADD with strap x 5      Manual Therapy   Manual Therapy Soft tissue mobilization;Myofascial release;Other (comment)    Manual therapy comments R side  lying with pillow btw knees    Soft tissue mobilization STM/DTM  to L glutes and piriformis - very ttp     Myofascial Release manual TPR to L glutes and piriformis    Other Manual Therapy Provided instruction in self-STM/TPR to glutes/piriformis with small ball on wall                  PT Education - 10/12/19 1200    Education Details PT eval findings, anticipated POC, gait corrections and initial HEP    Person(s) Educated Patient    Methods Explanation;Demonstration;Verbal cues;Handout;Other (comment)    Comprehension Verbalized understanding;Returned demonstration;Verbal cues required;Need further instruction            PT Short Term Goals - 10/12/19 1203      PT SHORT TERM GOAL #1   Title Patient will be independent with initial HEP    Status New    Target Date 10/26/19      PT SHORT TERM GOAL #2   Title Patient will demonstrate improved L hip strength to >/= 3+ to 4-/5 for improved stability and ease of mobility    Status New    Target Date 11/02/19             PT Long Term Goals - 10/12/19 1203      PT LONG TERM GOAL #1   Title Patient will be independent with ongoing/advanced HEP    Status New    Target Date 11/23/19      PT LONG TERM GOAL #2   Title Patient to improve L hip [facility]OM to [facility] without pain provocation    Status New    Target Date 11/23/19      PT LONG TERM GOAL #3   Title Patient will demonstrate improved L LE strength to >/= 4 to 4+/5 for improved stability and ease of mobility    Status New    Target Date 11/23/19      PT LONG TERM GOAL #4   Title Patient will ambulate with normal gait pattern with or w/o SPC or LRAD    Status New    Target Date 11/23/19      PT LONG TERM GOAL #5   Title Patient to report ability to perform ADLs, household and work-related tasks without increased L hip pain    Status New    Target Date 11/23/19                  Plan - 10/12/19 1203    Clinical Impression Statement Monica Blanchard is a 44 y/o female who presents to OP PT 6 days s/p L THA secondary to AVN on  10/06/19. She continues to experience moderate to high pain levels and notes feeling of "grinding" in the hip joint and sensation of sitting on ball in her buttock. L hip AROM severely limited due to pain, tightness and weakness with all MMT of L hip only </= 2/5 along with weakness in L knee and ankle. She demonstrated a very antalgic gait pattern on L with flexed trunk posture over RW but improved after height adjustment for RW and cues to improve posture and RW proximity. Monica Blanchard will benefit from skilled PT to address above deficits to improve proximal LE flexibility and decrease abnormal muscle tension to reduce or eliminate L hip pain, restore functional L hip ROM and LE strength, and improve walking and activity tolerance with eventual weaning of AD.    Personal Factors and Comorbidities Comorbidity 3+;Past/Current Experience;Fitness  Comorbidities OA, AVN L hip, HTN, h/o L wrist fracture    Examination-Activity Limitations Bathing;Bed Mobility;Bend;Caring for Others;Carry;Dressing;Hygiene/Grooming;Lift;Locomotion Level;Sit;Sleep;Squat;Stairs;Stand;Toileting;Transfers    Examination-Participation Restrictions Cleaning;Community Activity;Driving;Interpersonal Relationship;Laundry;Meal Prep;Shop;Yard Work    Stability/Clinical Decision Making Stable/Uncomplicated    Clinical Decision Making Low    Rehab Potential Excellent    PT Frequency 2x / week    PT Duration 6 weeks    PT Treatment/Interventions ADLs/Self Care Home Management;Cryotherapy;Electrical Stimulation;Iontophoresis 4mg /ml Dexamethasone;Moist Heat;DME Instruction;Gait training;Stair training;Functional mobility training;Therapeutic activities;Therapeutic exercise;Balance training;Neuromuscular re-education;Patient/family education;Manual techniques;Scar mobilization;Passive range of motion;Dry needling;Taping    PT Next Visit Plan Review initial HEP; progress L hip ROM and strengthening; gait training; manual therapy to address pain and  increased muscle tension, modalities PRN    PT Home Exercise Plan 7/15 - seated hip ABD/ADD, ADD pillow isometric, supine quad & glute sets +/- PPT, heel slides, AAROM hip ABD/ADD    Consulted and Agree with Plan of Care Patient           Patient will benefit from skilled therapeutic intervention in order to improve the following deficits and impairments:  Abnormal gait, Decreased activity tolerance, Decreased balance, Decreased endurance, Decreased knowledge of precautions, Decreased knowledge of use of DME, Decreased mobility, Decreased range of motion, Decreased safety awareness, Decreased scar mobility, Decreased strength, Difficulty walking, Increased muscle spasms, Impaired perceived functional ability, Impaired flexibility, Improper body mechanics, Postural dysfunction, Pain  Visit Diagnosis: Pain in left hip  Stiffness of left hip, not elsewhere classified  Muscle weakness (generalized)  Difficulty in walking, not elsewhere classified  Other abnormalities of gait and mobility     Problem List Patient Active Problem List   Diagnosis Date Noted  . 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 Avascular necrosis of bone of left hip (HCC) 09/19/2019    09/21/2019, PT, MPT 10/12/2019, 12:46 PM  [facility] [address]  [address] [place], [place], [place] Phone: [phone]   Fax:  [phone]  Name: Monica Blanchard MRN: Geraldine Contras Date of Birth: September 11, 1975

## 2019-10-16 ENCOUNTER — Ambulatory Visit (INDEPENDENT_AMBULATORY_CARE_PROVIDER_SITE_OTHER): Payer: Medicaid Other

## 2019-10-16 ENCOUNTER — Other Ambulatory Visit: Payer: Self-pay

## 2019-10-16 ENCOUNTER — Telehealth: Payer: Self-pay

## 2019-10-16 ENCOUNTER — Ambulatory Visit (INDEPENDENT_AMBULATORY_CARE_PROVIDER_SITE_OTHER): Payer: Medicaid Other | Admitting: Physician Assistant

## 2019-10-16 ENCOUNTER — Encounter: Payer: Self-pay | Admitting: Physician Assistant

## 2019-10-16 DIAGNOSIS — Z96642 Presence of left artificial hip joint: Secondary | ICD-10-CM | POA: Diagnosis not present

## 2019-10-16 MED ORDER — OXYCODONE HCL 5 MG PO TABS: 5.0000 mg | ORAL_TABLET | ORAL | 0 refills | Status: DC | PRN

## 2019-10-16 NOTE — Telephone Encounter (Signed)
Patient called stating that from her left thigh up is swollen.  Stated that it doesn't hurt where the incision is, but around the incision and into her buttocks.  Patient had left total hip on 10/06/2019.  CB# [phone].  Please advise.  Thank you.

## 2019-10-16 NOTE — Progress Notes (Signed)
HPI: Monica Blanchard comes in today status post left total hip arthroplasty 10/06/2019.  She states she began having pain in her groin area of the left hip some 2 days ago.  She has had no new injury.  Denies any redness drainage.  States she does have some warmth about the hip at night and some swelling.  Physical exam: Left hip surgical incisions healing well no signs of infection.  Skin is well approximated with staples.  No seroma.  Fluid motion with internal rotation external rotation.  She does have some discomfort in the groin with internal rotation.  Left calf supple nontender.   Radiographs: AP pelvis lateral view left hip shows no acute fractures.  Left total hip components are well seated.  No bony abnormalities.  Impression: Status post left total hip arthroplasty 10/06/2019  Plan: We will see her back in her regularly scheduled appointment for staple removal  Did call in her pain medicine which she had ran out of.  She will discontinue all exercises except for walking for exercise.  Questions encouraged and answered at length.

## 2019-10-16 NOTE — Telephone Encounter (Signed)
Worked patient in today at [employer]

## 2019-10-18 ENCOUNTER — Other Ambulatory Visit: Payer: Medicaid Other

## 2019-10-18 ENCOUNTER — Encounter: Payer: Self-pay | Admitting: Physician Assistant

## 2019-10-18 ENCOUNTER — Ambulatory Visit: Payer: Medicaid Other

## 2019-10-18 ENCOUNTER — Other Ambulatory Visit: Payer: Self-pay

## 2019-10-18 ENCOUNTER — Ambulatory Visit (INDEPENDENT_AMBULATORY_CARE_PROVIDER_SITE_OTHER): Payer: Medicaid Other | Admitting: Physician Assistant

## 2019-10-18 VITALS — Ht 69.0 in | Wt 228.0 lb

## 2019-10-18 DIAGNOSIS — M25552 Pain in left hip: Secondary | ICD-10-CM | POA: Diagnosis not present

## 2019-10-18 DIAGNOSIS — R2689 Other abnormalities of gait and mobility: Secondary | ICD-10-CM

## 2019-10-18 DIAGNOSIS — Z96642 Presence of left artificial hip joint: Secondary | ICD-10-CM

## 2019-10-18 DIAGNOSIS — R262 Difficulty in walking, not elsewhere classified: Secondary | ICD-10-CM

## 2019-10-18 DIAGNOSIS — M6281 Muscle weakness (generalized): Secondary | ICD-10-CM

## 2019-10-18 DIAGNOSIS — M25652 Stiffness of left hip, not elsewhere classified: Secondary | ICD-10-CM

## 2019-10-18 NOTE — Therapy (Signed)
[facility] [address]  [address] [place], [place], [address] Phone: [phone]   Fax:  [phone]  Physical Therapy Treatment  Patient Details  Name: Monica Blanchard MRN: [id number] Date of Birth: Jan 10, 1976 Referring Provider (PT): Doneen Poisson, MD   Encounter Date: 10/18/2019   PT End of Session - 10/18/19 1456    Visit Number 2    Number of Visits 13    Date for PT Re-Evaluation 11/23/19    Authorization Type Medicaid [employer]    PT Start Time 1453    PT Stop Time 1533    PT Time Calculation (min) 40 min    Activity Tolerance Patient tolerated treatment well    Behavior During Therapy [facility] for tasks assessed/performed           Past Medical History:  Diagnosis Date  . Anemia   . Arthritis   . Ectopic pregnancy    x 2  . Hypertension     Past Surgical History:  Procedure Laterality Date  . APPENDECTOMY    . CESAREAN SECTION    . ECTOPIC PREGNANCY SURGERY    . TOTAL HIP ARTHROPLASTY Left 10/06/2019   Procedure: LEFT TOTAL HIP ARTHROPLASTY ANTERIOR APPROACH;  Surgeon: Kathryne Hitch, MD;  Location: [facility];  Service: Orthopedics;  Laterality: Left;  . WRIST FRACTURE SURGERY      There were no vitals filed for this visit.   Subjective Assessment - 10/18/19 1456    Subjective Pt. reporting she has been having "issues with swelling" and saw MD on Monday.    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 Yes    Pain Score 6     Pain Location Hip    Pain Orientation Left    Pain Descriptors / Indicators Discomfort    Pain Type Surgical pain    Multiple Pain Sites No                             [facility] Adult PT Treatment/Exercise - 10/18/19 0001      Transfers   Transfers Sit to Stand;Stand to Sit    Sit to Stand 5: Supervision    Sit to Stand Details  (indicate cue type and reason) cues to avoid pulling up on RW    Stand to Sit 5: Supervision    Stand to Sit Details required cueing to reach back and lower herself down with hands and for proper BOS positioning with RW next to seat before sitting       Knee/Hip Exercises: Aerobic   Nustep lvl 1, 4 min (UE/LE)      Knee/Hip Exercises: Seated   Abduction/Adduction  Left;AROM;10 reps   difficulty performing without assistance; very slow movement     Knee/Hip Exercises: Supine   Quad Sets Left;10 reps   5" hold with pillow squeeze    Quad Sets Limitations pillow squeeze     Heel Slides Left;AROM;10 reps   slow performance with strap assistance    Heel Slides Limitations with strap assistance     Other Supine Knee/Hip Exercises Glute sets & glute sets 5" x 10     Other Supine Knee/Hip Exercises L AAROM hip ABD/ADD with strap x 10 reps  PT Short Term Goals - 10/18/19 1511      PT SHORT TERM GOAL #1   Title Patient will be independent with initial HEP    Status On-going    Target Date 10/26/19      PT SHORT TERM GOAL #2   Title Patient will demonstrate improved L hip strength to >/= 3+ to 4-/5 for improved stability and ease of mobility    Status On-going    Target Date 11/02/19             PT Long Term Goals - 10/18/19 1511      PT LONG TERM GOAL #1   Title Patient will be independent with ongoing/advanced HEP    Status On-going      PT LONG TERM GOAL #2   Title Patient to improve L hip AROM to [facility] without pain provocation    Status On-going      PT LONG TERM GOAL #3   Title Patient will demonstrate improved L LE strength to >/= 4 to 4+/5 for improved stability and ease of mobility    Status On-going      PT LONG TERM GOAL #4   Title Patient will ambulate with normal gait pattern with or w/o SPC or LRAD    Status On-going      PT LONG TERM GOAL #5   Title Patient to report ability to perform ADLs, household and work-related tasks without  increased L hip pain    Status On-going                 Plan - 10/18/19 1518    Clinical Impression Statement Pt. reporting she stopped doing her HEP after end of last week after increased hip swelling/pain.  Saw MD on Monday who told her to hold off on HEP and just walk for a few days until returning for f/u this morning at MD office.  MD removed pt. staples and told her to resume attending PT.  Reviewed pt.'s HEP today which revealed pt. with excessively slow movement pattern and very hesitant to active anterior/lateral hip musculature requiring assistance at times to complete gentle HEP ROM activities.  Encouraged pt. to perform full HEP as listed on handout for improved ROM and utilize hand/strap assistance only when necessary to complete activities.  Pt. verbalized understanding.  Did require cueing today safe RW sit<>stand transfers.  Will continue to emphasize RW safety and progress ROM and gentle strengthening per pt. tolerance in coming session.    Comorbidities OA, AVN L hip, HTN, h/o L wrist fracture    Rehab Potential Excellent    PT Frequency 2x / week    PT Treatment/Interventions ADLs/Self Care Home Management;Cryotherapy;Electrical Stimulation;Iontophoresis 4mg /ml Dexamethasone;Moist Heat;DME Instruction;Gait training;Stair training;Functional mobility training;Therapeutic activities;Therapeutic exercise;Balance training;Neuromuscular re-education;Patient/family education;Manual techniques;Scar mobilization;Passive range of motion;Dry needling;Taping    PT Next Visit Plan Progress L hip ROM and strengthening; gait training; manual therapy to address pain and increased muscle tension, modalities PRN    PT Home Exercise Plan 7/15 - seated hip ABD/ADD, ADD pillow isometric, supine quad & glute sets +/- PPT, heel slides, AAROM hip ABD/ADD    Consulted and Agree with Plan of Care Patient           Patient will benefit from skilled therapeutic intervention in order to improve the  following deficits and impairments:  Abnormal gait, Decreased activity tolerance, Decreased balance, Decreased endurance, Decreased knowledge of precautions, Decreased knowledge of use of DME, Decreased mobility, Decreased range of motion,  Decreased safety awareness, Decreased scar mobility, Decreased strength, Difficulty walking, Increased muscle spasms, Impaired perceived functional ability, Impaired flexibility, Improper body mechanics, Postural dysfunction, Pain  Visit Diagnosis: Pain in left hip  Stiffness of left hip, not elsewhere classified  Muscle weakness (generalized)  Difficulty in walking, not elsewhere classified  Other abnormalities of gait and mobility     Problem List Patient Active Problem List   Diagnosis Date Noted  . 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 Avascular necrosis of bone of left hip [facility]) 09/19/2019    Kermit Balo, PTA 10/18/19 5:00 PM   [facility] [address]  [address] [place], [place], [address] Phone: [phone]   Fax:  [phone]  Name: Monica Blanchard MRN: [id number] Date of Birth: [birth year]

## 2019-10-18 NOTE — Progress Notes (Signed)
HPI: Monica Blanchard returns today status post left total hip arthroplasty.  This visit is mainly for staple removal.  She still having pain in the groin.  She is able to weight-bear on the leg.  States that the leg feels tight.  She had no fevers chills shortness of breath chest pain.  Physical exam: Left hip surgical incision sites healing well no signs of infection.  Staples well approximate the incision.  No seroma.  Left calf supple nontender.  Fluid range of motion of the hip.  Impression: Status post left total hip arthroplasty 10/06/2019  Plan: She will continue work with physical therapy on range of motion strengthening.  Staples removed Steri-Strips applied.  She is able to get the incision wet in shower.  We will see her back in 4 weeks sooner if there is any questions concerns.

## 2019-10-19 ENCOUNTER — Ambulatory Visit: Payer: Medicaid Other

## 2019-10-20 ENCOUNTER — Inpatient Hospital Stay: Payer: Medicaid Other | Admitting: Family

## 2019-10-23 ENCOUNTER — Other Ambulatory Visit: Payer: Self-pay

## 2019-10-23 ENCOUNTER — Encounter: Payer: Self-pay | Admitting: Physical Therapy

## 2019-10-23 ENCOUNTER — Ambulatory Visit: Payer: Medicaid Other | Admitting: Orthopedic Surgery

## 2019-10-23 ENCOUNTER — Ambulatory Visit: Payer: Medicaid Other | Admitting: Physical Therapy

## 2019-10-23 DIAGNOSIS — M6281 Muscle weakness (generalized): Secondary | ICD-10-CM

## 2019-10-23 DIAGNOSIS — M25552 Pain in left hip: Secondary | ICD-10-CM

## 2019-10-23 DIAGNOSIS — R262 Difficulty in walking, not elsewhere classified: Secondary | ICD-10-CM

## 2019-10-23 DIAGNOSIS — M25652 Stiffness of left hip, not elsewhere classified: Secondary | ICD-10-CM

## 2019-10-23 DIAGNOSIS — R2689 Other abnormalities of gait and mobility: Secondary | ICD-10-CM

## 2019-10-23 NOTE — Patient Instructions (Signed)
    Home exercise program created by Valree Feild, PT.  For questions, please contact Lyndsey Demos via phone at [phone] or email at [email or web address]  [facility] [place] [address]  [address] [place], [place], [address] Phone: [phone]   Fax:  [phone]    

## 2019-10-23 NOTE — Therapy (Signed)
[facility] [address]  [address] [place], [place], [address] Phone: [phone]   Fax:  [phone]  Physical Therapy Treatment  Patient Details  Name: Monica Blanchard MRN: [id number] Date of Birth: [birth year] Referring Provider (PT): Doneen Poisson, MD   Encounter Date: 10/23/2019   PT End of Session - 10/23/19 1448    Visit Number 3    Number of Visits 13    Date for PT Re-Evaluation 11/23/19    Authorization Type Medicaid [employer]    PT Start Time 1448    PT Stop Time 1543    PT Time Calculation (min) 55 min    Activity Tolerance Patient tolerated treatment well    Behavior During Therapy [facility] for tasks assessed/performed           Past Medical History:  Diagnosis Date  . Anemia   . Arthritis   . Ectopic pregnancy    x 2  . Hypertension     Past Surgical History:  Procedure Laterality Date  . APPENDECTOMY    . CESAREAN SECTION    . ECTOPIC PREGNANCY SURGERY    . TOTAL HIP ARTHROPLASTY Left 10/06/2019   Procedure: LEFT TOTAL HIP ARTHROPLASTY ANTERIOR APPROACH;  Surgeon: Kathryne Hitch, MD;  Location: [facility];  Service: Orthopedics;  Laterality: Left;  . WRIST FRACTURE SURGERY      There were no vitals filed for this visit.   Subjective Assessment - 10/23/19 1450    Subjective Pt arrived to PT walking with a SPC today - stating she has been trying to walk with just the cane now.    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 Yes    Pain Score 5     Pain Location Hip    Pain Orientation Left    Pain Descriptors / Indicators Tightness    Pain Type Surgical pain;Acute pain    Pain Frequency Constant              [facility] PT Assessment - 10/23/19 1448      Assessment   Medical Diagnosis L THA    Referring Provider (PT) Doneen Poisson, MD    Onset Date/Surgical  Date 10/06/19    Next MD Visit 11/15/19                         [facility] Adult PT Treatment/Exercise - 10/23/19 1448      Ambulation/Gait   Ambulation/Gait Assistance 5: Supervision    Ambulation/Gait Assistance Details Adjusted height of SPC to reduce R shoulder hike and reduce R lean. Cues for even weight shift and increased stance time on L.    Ambulation Distance (Feet) 150 Feet    Assistive device Straight cane    Gait Pattern Step-through pattern;Antalgic;Lateral trunk lean to right;Decreased weight shift to left;Decreased stance time - left;Decreased stride length;Decreased hip/knee flexion - left;Decreased hip/knee flexion - right    Ambulation Surface Level;Indoor      Knee/Hip Exercises: Aerobic   Nustep L1 x 2 min, L2 x 4 min (UE/LE)   seat #11     Knee/Hip Exercises: Standing   Hip Flexion Left;5 reps;2 sets;AROM;Stengthening;Knee straight;Knee bent    Hip Flexion Limitations 5 reps each - SLR & bent knee march    Hip Abduction Left;10 reps;AROM;Stengthening;Knee straight  Abduction Limitations cues to activate glutes and keep trunk upright    Hip Extension Left;10 reps;AAROM;Knee straight    Extension Limitations cues to avoid excessive fwd trunk lean      Knee/Hip Exercises: Supine   Straight Leg Raises Left;10 reps;AAROM    Other Supine Knee/Hip Exercises L AROM hip ABD/ADD x 10 reps      Modalities   Modalities Cryotherapy      Cryotherapy   Number Minutes Cryotherapy 10 Minutes    Cryotherapy Location Hip   Lt ant hip/groin   Type of Cryotherapy Ice pack      Manual Therapy   Manual Therapy Soft tissue mobilization;Myofascial release    Manual therapy comments supine    Soft tissue mobilization STM/DTM to L hip adductors, lateral glutes and piriformis - very ttp in hip adductors    Myofascial Release manual TPR to L hip adductors - better exercise tolerance following MT    Other Manual Therapy Provided instruction in self-STM/TPR to hip  adductors with manual pressure or rolling pin.                  PT Education - 10/23/19 1530    Education Details HEP update - 3-way standing SLR/march; gait training with SPC    Person(s) Educated Patient    Methods Explanation;Demonstration;Verbal cues;Handout    Comprehension Verbalized understanding;Returned demonstration;Verbal cues required;Need further instruction            PT Short Term Goals - 10/18/19 1511      PT SHORT TERM GOAL #1   Title Patient will be independent with initial HEP    Status On-going    Target Date 10/26/19      PT SHORT TERM GOAL #2   Title Patient will demonstrate improved L hip strength to >/= 3+ to 4-/5 for improved stability and ease of mobility    Status On-going    Target Date 11/02/19             PT Long Term Goals - 10/18/19 1511      PT LONG TERM GOAL #1   Title Patient will be independent with ongoing/advanced HEP    Status On-going      PT LONG TERM GOAL #2   Title Patient to improve L hip AROM to [facility] without pain provocation    Status On-going      PT LONG TERM GOAL #3   Title Patient will demonstrate improved L LE strength to >/= 4 to 4+/5 for improved stability and ease of mobility    Status On-going      PT LONG TERM GOAL #4   Title Patient will ambulate with normal gait pattern with or w/o SPC or LRAD    Status On-going      PT LONG TERM GOAL #5   Title Patient to report ability to perform ADLs, household and work-related tasks without increased L hip pain    Status On-going                 Plan - 10/23/19 1456    Clinical Impression Statement Monica Blanchard arrived to PT today ambulating with [facility] with pronounce R lateral lean and R shoulder hike as well as continued limited weight shift to L LE. Corrected height of cane and provided cues for more even weight shift to normalize gait pattern with some improvement noted. She continues to be limited in both supine gravity minimized and gravity resisted  exercises due to pain, mostly in  groin area. Increased tightness noted in hip adductors and to a lesser degree in glutes - addressed with manual STM and TPR with pt able to better complete AROM hip ADB/ADD in supine as well as AAROM SLR following MT. Instruction provided in self-STM for home carryover. Introduced standing 3-way hip to facilitate increased AROM and strengthening with pt able to complete movement in all directions, therefore HEP updated accordingly.    Comorbidities OA, AVN L hip, HTN, h/o L wrist fracture    Rehab Potential Excellent    PT Frequency 2x / week    PT Duration 6 weeks    PT Treatment/Interventions ADLs/Self Care Home Management;Cryotherapy;Electrical Stimulation;Iontophoresis 4mg /ml Dexamethasone;Moist Heat;DME Instruction;Gait training;Stair training;Functional mobility training;Therapeutic activities;Therapeutic exercise;Balance training;Neuromuscular re-education;Patient/family education;Manual techniques;Scar mobilization;Passive range of motion;Dry needling;Taping    PT Next Visit Plan Progress L hip ROM and strengthening; gait training; manual therapy to address pain and increased muscle tension, modalities PRN    PT Home Exercise Plan 7/15 - seated hip ABD/ADD, ADD pillow isometric, supine quad & glute sets +/- PPT, heel slides, AAROM hip ABD/ADD; 7/26 - standing 3-way hip SLR/march    Consulted and Agree with Plan of Care Patient           Patient will benefit from skilled therapeutic intervention in order to improve the following deficits and impairments:  Abnormal gait, Decreased activity tolerance, Decreased balance, Decreased endurance, Decreased knowledge of precautions, Decreased knowledge of use of DME, Decreased mobility, Decreased range of motion, Decreased safety awareness, Decreased scar mobility, Decreased strength, Difficulty walking, Increased muscle spasms, Impaired perceived functional ability, Impaired flexibility, Improper body mechanics, Postural  dysfunction, Pain  Visit Diagnosis: Pain in left hip  Stiffness of left hip, not elsewhere classified  Muscle weakness (generalized)  Difficulty in walking, not elsewhere classified  Other abnormalities of gait and mobility     Problem List Patient Active Problem List   Diagnosis Date Noted  . 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 Avascular necrosis of bone of left hip (HCC) 09/19/2019    09/21/2019, PT, MPT 10/23/2019, 5:37 PM  [facility] [address]  [address] [place], [place], [place] Phone: [phone]   Fax:  [phone]  Name: Monica Blanchard MRN: Geraldine Contras Date of Birth: Jul 15, 1975

## 2019-10-26 ENCOUNTER — Encounter: Payer: Self-pay | Admitting: Physical Therapy

## 2019-10-26 ENCOUNTER — Ambulatory Visit: Payer: Medicaid Other | Admitting: Physical Therapy

## 2019-10-26 ENCOUNTER — Other Ambulatory Visit: Payer: Self-pay

## 2019-10-26 DIAGNOSIS — R262 Difficulty in walking, not elsewhere classified: Secondary | ICD-10-CM

## 2019-10-26 DIAGNOSIS — M6281 Muscle weakness (generalized): Secondary | ICD-10-CM

## 2019-10-26 DIAGNOSIS — M25552 Pain in left hip: Secondary | ICD-10-CM | POA: Diagnosis not present

## 2019-10-26 DIAGNOSIS — R2689 Other abnormalities of gait and mobility: Secondary | ICD-10-CM

## 2019-10-26 DIAGNOSIS — M25652 Stiffness of left hip, not elsewhere classified: Secondary | ICD-10-CM

## 2019-10-26 NOTE — Therapy (Signed)
[facility] [address]  [address] [place], [place], [address] Phone: [phone]   Fax:  [phone]  Physical Therapy Treatment  Patient Details  Name: Monica Blanchard MRN: [id number] Date of Birth: 1975-10-12 Referring Provider (PT): Doneen Poisson, MD   Encounter Date: 10/26/2019   PT End of Session - 10/26/19 1523    Visit Number 4    Number of Visits 13    Date for PT Re-Evaluation 11/23/19    Authorization Type Medicaid [employer]    PT Start Time 1523    PT Stop Time 1624    PT Time Calculation (min) 61 min    Activity Tolerance Patient tolerated treatment well    Behavior During Therapy [facility] for tasks assessed/performed           Past Medical History:  Diagnosis Date  . Anemia   . Arthritis   . Ectopic pregnancy    x 2  . Hypertension     Past Surgical History:  Procedure Laterality Date  . APPENDECTOMY    . CESAREAN SECTION    . ECTOPIC PREGNANCY SURGERY    . TOTAL HIP ARTHROPLASTY Left 10/06/2019   Procedure: LEFT TOTAL HIP ARTHROPLASTY ANTERIOR APPROACH;  Surgeon: Kathryne Hitch, MD;  Location: [facility];  Service: Orthopedics;  Laterality: Left;  . WRIST FRACTURE SURGERY      There were no vitals filed for this visit.   Subjective Assessment - 10/26/19 1526    Subjective Pt reports the pain feels like it is in the bone and incision but also extends down into her tibia. Pain still causes her to limp at times. Pt admits to not attempting standing HEP exercises at home but has been trying to work on initial HEP.    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 Yes    Pain Score 6    5-6/10   Pain Location Hip    Pain Orientation Left    Pain Descriptors / Indicators Aching    Pain Type Surgical pain;Acute pain    Pain Frequency Constant                              [facility] Adult PT Treatment/Exercise - 10/26/19 1523      Exercises   Exercises Knee/Hip      Knee/Hip Exercises: Stretches   Hip Flexor Stretch Left;30 seconds;2 reps    Hip Flexor Stretch Limitations PT assisted mod thomas with slight overpressure into knee flexion      Knee/Hip Exercises: Aerobic   Nustep L2 x 6' (UE/LE)   seat #11     Knee/Hip Exercises: Standing   Hip Flexion Left;Right;10 reps;2 sets;Stengthening;AROM;Knee straight;Knee bent    Hip Flexion Limitations 1 set each - SLR & bent knee march    Hip Abduction Left;Right;10 reps;AROM;Stengthening;Knee straight    Abduction Limitations alt LE - cues to activate glutes and keep trunk upright    Hip Extension Left;Right;10 reps;AROM;Stengthening;Knee straight    Extension Limitations alt LE - cues to avoid excessive fwd trunk lean      Knee/Hip Exercises: Seated   Long Arc Quad Left;10 reps;Strengthening;Weights    Long Arc Quad Weight 2 lbs.    Clamshell with TheraBand Red   alt hip ABD/ER x 10  Marching Left;Right;AROM;10 reps    Marching Limitations very limited lift on L but improvin with increased reps    Hamstring Curl Left;10 reps;Strengthening    Hamstring Limitations red TB      Modalities   Modalities Electrical Stimulation;Cryotherapy      Cryotherapy   Number Minutes Cryotherapy 10 Minutes    Cryotherapy Location Hip   Lt ant hip/groin   Type of Cryotherapy Ice pack      Electrical Stimulation   Electrical Stimulation Location L anterior hip    Electrical Stimulation Action IFC    Electrical Stimulation Parameters 80-150 Hz, intensity to pt tol x 10'    Electrical Stimulation Goals Pain;Tone      Manual Therapy   Manual Therapy Soft tissue mobilization;Myofascial release    Manual therapy comments supine & slight hook lying    Soft tissue mobilization STM/DTM to L hip flexors and proximal quads - very ttp     Myofascial Release manual TPR to L hip flexors  (proximal iliacus and distal iliopsoas) ans proximal quads                   PT Education - 10/26/19 1615    Education Details Info on home TENS unit    Person(s) Educated Patient    Methods Explanation;Handout    Comprehension Verbalized understanding            PT Short Term Goals - 10/26/19 1529      PT SHORT TERM GOAL #1   Title Patient will be independent with initial HEP    Status Achieved      PT SHORT TERM GOAL #2   Title Patient will demonstrate improved L hip strength to >/= 3+ to 4-/5 for improved stability and ease of mobility    Status On-going    Target Date 11/02/19             PT Long Term Goals - 10/26/19 1530      PT LONG TERM GOAL #1   Title Patient will be independent with ongoing/advanced HEP    Status On-going    Target Date 11/23/19      PT LONG TERM GOAL #2   Title Patient to improve L hip AROM to [facility] without pain provocation    Status On-going    Target Date 11/23/19      PT LONG TERM GOAL #3   Title Patient will demonstrate improved L LE strength to >/= 4 to 4+/5 for improved stability and ease of mobility    Status On-going    Target Date 11/23/19      PT LONG TERM GOAL #4   Title Patient will ambulate with normal gait pattern with or w/o SPC or LRAD    Status On-going    Target Date 11/23/19      PT LONG TERM GOAL #5   Title Patient to report ability to perform ADLs, household and work-related tasks without increased L hip pain    Status On-going    Target Date 11/23/19                 Plan - 10/26/19 1531    Clinical Impression Statement Monica Blanchard continues to report increased pain causing her to limp when walking with the cane - suggested pt try going back to RW to help offload L LE until pain subsides. Pt admits to not attempting standing HEP exercises and notes continued difficulty with any activity that requires her to lift her  leg (flex her hip). Reviewed standing HEP exercises with pt able to provide good  return demonstration but decreased lift noted on L with hip flexion march. Pt also having difficulty initiating hip flexion march in sitting but ROM improving with subsequent reps. Pt reports ttp over L hip adductors seems to have resolved today but continue ttp and increased muscle tension persist in hip flexors and proximal quads - addressed with manual STM/TPR and stretching with some relief noted. Given ongoing pain and increased muscle tension concluded session with trial of estim along with ice - limited tolerance for ice but some pain relief noted with estim, therefore information provided regarding home TENS unit.    Comorbidities OA, AVN L hip, HTN, h/o L wrist fracture    Examination-Activity Limitations Bathing;Bed Mobility;Bend;Caring for Others;Carry;Dressing;Hygiene/Grooming;Lift;Locomotion Level;Sit;Sleep;Squat;Stairs;Stand;Toileting;Transfers    Examination-Participation Restrictions Cleaning;Community Activity;Driving;Interpersonal Relationship;Laundry;Meal Prep;Shop;Yard Work    Designer, fashion/clothing    PT Frequency 2x / week    PT Duration 6 weeks    PT Treatment/Interventions ADLs/Self Care Home Management;Cryotherapy;Electrical Stimulation;Iontophoresis 4mg /ml Dexamethasone;Moist Heat;DME Instruction;Gait training;Stair training;Functional mobility training;Therapeutic activities;Therapeutic exercise;Balance training;Neuromuscular re-education;Patient/family education;Manual techniques;Scar mobilization;Passive range of motion;Dry needling;Taping    PT Next Visit Plan Progress L hip ROM and strengthening; gait training; manual therapy to address pain and increased muscle tension, modalities PRN    PT Home Exercise Plan 7/15 - seated hip ABD/ADD, ADD pillow isometric, supine quad & glute sets +/- PPT, heel slides, AAROM hip ABD/ADD; 7/26 - standing 3-way hip SLR/march    Consulted and Agree with Plan of Care Patient           Patient will benefit from skilled therapeutic  intervention in order to improve the following deficits and impairments:  Abnormal gait, Decreased activity tolerance, Decreased balance, Decreased endurance, Decreased knowledge of precautions, Decreased knowledge of use of DME, Decreased mobility, Decreased range of motion, Decreased safety awareness, Decreased scar mobility, Decreased strength, Difficulty walking, Increased muscle spasms, Impaired perceived functional ability, Impaired flexibility, Improper body mechanics, Postural dysfunction, Pain  Visit Diagnosis: Pain in left hip  Stiffness of left hip, not elsewhere classified  Muscle weakness (generalized)  Difficulty in walking, not elsewhere classified  Other abnormalities of gait and mobility     Problem List Patient Active Problem List   Diagnosis Date Noted  . 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 Avascular necrosis of bone of left hip (HCC) 09/19/2019    09/21/2019, PT, MPT 10/26/2019, 5:17 PM  [facility] [address]  [address] [place], [place], [place] Phone: [phone]   Fax:  [phone]  Name: Monica Blanchard MRN: Geraldine Contras Date of Birth: 1975-05-11

## 2019-10-26 NOTE — Patient Instructions (Signed)
TENS UNIT  This is helpful for muscle pain and spasm.   Search and Purchase a TENS 7000 2nd edition at www.tenspros.com or www.amazon.com  (It should be less than $30)     TENS unit instructions:   Do not shower or bathe with the unit on  Turn the unit off before removing electrodes or batteries  If the electrodes lose stickiness add a drop of water to the electrodes after they are disconnected from the unit and place on plastic sheet. If you continued to have difficulty, call the TENS unit company to purchase more electrodes.  Do not apply lotion on the skin area prior to use. Make sure the skin is clean and dry as this will help prolong the life of the electrodes.  After use, always check skin for unusual red areas, rash or other skin difficulties. If there are any skin problems, does not apply electrodes to the same area.  Never remove the electrodes from the unit by pulling the wires.  Do not use the TENS unit or electrodes other than as directed.  Do not change electrode placement without consulting your therapist or physician.  Keep 2 fingers with between each electrode.   TENS stands for Transcutaneous Electrical Nerve Stimulation. In other words, electrical impulses are allowed to pass through the skin in order to excite a nerve.   Purpose and Use of TENS:  TENS is a method used to manage acute and chronic pain without the use of drugs. It has been effective in managing pain associated with surgery, sprains, strains, trauma, rheumatoid arthritis, and neuralgias. It is a non-addictive, low risk, and non-invasive technique used to control pain. It is not, by any means, a curative form of treatment.   How TENS Works:  Most TENS units are a Statistician unit powered by one 9 volt battery. Attached to the outside of the unit are two lead wires where two pins and/or snaps connect on each wire. All units come with a set of four reusable pads or electrodes. These are placed  on the skin surrounding the area involved. By inserting the leads into  the pads, the electricity can pass from the unit making the circuit complete.  As the intensity is turned up slowly, the electrical current enters the body from the electrodes through the skin to the surrounding nerve fibers. This triggers the release of hormones from within the body. These hormones contain pain relievers. By increasing the circulation of these hormones, the person's pain may be lessened. It is also believed that the electrical stimulation itself helps to block the pain messages being sent to the brain, thus also decreasing the body's perception of pain.   Hazards:  TENS units are NOT to be used by patients with PACEMAKERS, DEFIBRILLATORS, DIABETIC PUMPS, PREGNANT WOMEN, and patients with SEIZURE DISORDERS.  TENS units are NOT to be used over the heart, throat, brain, or spinal cord.  One of the major side effects from the TENS unit may be skin irritation. Some people may develop a rash if they are sensitive to the materials used in the electrodes or the connecting wires.   Wear the unit for 30-45 minutes at a time, as many times as needed during the day for pain..   Avoid overuse due the body getting used to the stem making it not as effective over time.

## 2019-10-30 ENCOUNTER — Ambulatory Visit: Payer: Medicaid Other | Attending: Orthopaedic Surgery

## 2019-10-30 ENCOUNTER — Other Ambulatory Visit: Payer: Self-pay

## 2019-10-30 DIAGNOSIS — M25552 Pain in left hip: Secondary | ICD-10-CM | POA: Diagnosis present

## 2019-10-30 DIAGNOSIS — R262 Difficulty in walking, not elsewhere classified: Secondary | ICD-10-CM | POA: Diagnosis present

## 2019-10-30 DIAGNOSIS — R2689 Other abnormalities of gait and mobility: Secondary | ICD-10-CM

## 2019-10-30 DIAGNOSIS — M6281 Muscle weakness (generalized): Secondary | ICD-10-CM | POA: Diagnosis present

## 2019-10-30 DIAGNOSIS — M25652 Stiffness of left hip, not elsewhere classified: Secondary | ICD-10-CM | POA: Insufficient documentation

## 2019-10-30 NOTE — Therapy (Addendum)
[facility] [place] [address]  [address] [place], [place], [address] Phone: [phone]   Fax:  [phone]  Physical Therapy Treatment  Patient Details  Name: Monica Blanchard MRN: [id number] Date of Birth: 03/05/1976 Referring Provider (PT): Christopher Blackman, MD   Encounter Date: 10/30/2019   PT End of Session - 10/30/19 1405    Visit Number 5    Number of Visits 13    Date for PT Re-Evaluation 11/23/19    Authorization Type Medicaid [employer]    PT Start Time 1401    PT Stop Time 1445    PT Time Calculation (min) 44 min    Activity Tolerance Patient tolerated treatment well    Behavior During Therapy WFL for tasks assessed/performed           Past Medical History:  Diagnosis Date  . Anemia   . Arthritis   . Ectopic pregnancy    x 2  . Hypertension     Past Surgical History:  Procedure Laterality Date  . APPENDECTOMY    . CESAREAN SECTION    . ECTOPIC PREGNANCY SURGERY    . TOTAL HIP ARTHROPLASTY Left 10/06/2019   Procedure: LEFT TOTAL HIP ARTHROPLASTY ANTERIOR APPROACH;  Surgeon: Blackman, Christopher Y, MD;  Location: [facility];  Service: Orthopedics;  Laterality: Left;  . WRIST FRACTURE SURGERY      There were no vitals filed for this visit.   Subjective Assessment - 10/30/19 1404    Subjective Pt. noting she has been having occasional L anterior LE pain which shoots the knee and anterior shin.    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 Yes    Pain Score 5     Pain Location Hip    Pain Orientation Left    Pain Descriptors / Indicators Aching    Pain Type Acute pain;Surgical pain    Pain Onset 1 to 4 weeks ago    Pain Frequency Constant    Multiple Pain Sites No              [facility] PT Assessment - 10/30/19 0001      Strength   Strength Assessment Site Hip    Right/Left Hip  Right;Left    Left Hip Flexion 3+/5    Left Hip ABduction 3-/5   tested in sidelying    Left Hip ADduction 3+/5   tested in standing in RW   Right/Left Knee --    Left Knee Extension 3+/5                         [facility] Adult PT Treatment/Exercise - 10/30/19 0001      Ambulation/Gait   Ambulation/Gait Yes    Ambulation/Gait Assistance 5: Supervision    Ambulation/Gait Assistance Details Gait training with RW: encouraged pt. for increased B step-length, upright trunk     Ambulation Distance (Feet) 180 Feet    Assistive device Rolling walker    Gait Pattern Step-through pattern;Antalgic;Lateral trunk lean to right;Decreased weight shift to left;Decreased stance time - left;Decreased stride length;Decreased hip/knee flexion - left    Ambulation Surface Level;Indoor      Knee/Hip Exercises: Stretches   ITB Stretch Left;1 rep;30 seconds    ITB Stretch Limitations Manual with therapist    Piriformis Stretch Left;1 rep;30 seconds      Piriformis Stretch Limitations gentle manual with therapist to ensure decreased muscle guarding     Other Knee/Hip Stretches L lunge hip flexor strech in RW 3 x 30 sec       Knee/Hip Exercises: Aerobic   Nustep L2 x 6' (UE/LE)      Knee/Hip Exercises: Standing   Heel Raises Both;15 reps    Heel Raises Limitations cues for L weight shift and quad set/glute set    Hip Flexion Right;Left;10 reps;Knee bent;Stengthening    Hip Flexion Limitations cues for increased L hip flexion     Hip Abduction Left;Right;10 reps;AROM;Stengthening;Knee straight    Abduction Limitations standing at counter    Hip Extension Left;Right;10 reps;AROM;Stengthening;Knee straight    Extension Limitations standing at counter       Knee/Hip Exercises: Supine   Other Supine Knee/Hip Exercises hooklying clam shell with red TB at knees (L only) x 10                   PT Education - 10/30/19 1513    Education Details HEP update; standing hip flexor strethc in RW     Person(s) Educated Patient    Methods Explanation;Demonstration;Verbal cues;Handout    Comprehension Verbalized understanding;Returned demonstration;Verbal cues required            PT Short Term Goals - 10/30/19 1459      PT SHORT TERM GOAL #1   Title Patient will be independent with initial HEP    Status Achieved      PT SHORT TERM GOAL #2   Title Patient will demonstrate improved L hip strength to >/= 3+ to 4-/5 for improved stability and ease of mobility    Status Partially Met   08/02: met for L hip flexion, adduction 3+/5 strength with MMT   Target Date 11/02/19             PT Long Term Goals - 10/26/19 1530      PT LONG TERM GOAL #1   Title Patient will be independent with ongoing/advanced HEP    Status On-going    Target Date 11/23/19      PT LONG TERM GOAL #2   Title Patient to improve L hip AROM to [facility] without pain provocation    Status On-going    Target Date 11/23/19      PT LONG TERM GOAL #3   Title Patient will demonstrate improved L LE strength to >/= 4 to 4+/5 for improved stability and ease of mobility    Status On-going    Target Date 11/23/19      PT LONG TERM GOAL #4   Title Patient will ambulate with normal gait pattern with or w/o SPC or LRAD    Status On-going    Target Date 11/23/19      PT LONG TERM GOAL #5   Title Patient to report ability to perform ADLs, household and work-related tasks without increased L hip pain    Status On-going    Target Date 11/23/19                 Plan - 10/30/19 1406    Clinical Impression Statement Pt. reporting she has not been sleeping at night and attributes this to L anterior hip pain.  Did improve her standing HEP adherence without issue.  Updated HEP with standing L hip flexor stretch for improved stride length as pt. tolerated this activity well in session.  Worked with pt. gait training focused on improving posture  and step-length/L weight shift.  All standing strengthening activities  closely monitored to avoid substitutions.  Pt. primary complaint today was intermittent feeling of "bone on bone" pain in L anterior/lateral hip.  encouraged pt. to mention this to MD at upcoming f/u.  Able to partially meet STG #2 demonstrating L hip flexion, adduction 3+/5 strength with MMT however L hip abduction remaining painful/difficult requiring gravity minimized strengthening activities.  Ended visit with pt. reporting pain no greater than baseline and declining E-stim. as she notes limited benefit.    Comorbidities OA, AVN L hip, HTN, h/o L wrist fracture    Rehab Potential Excellent    PT Frequency 2x / week    PT Duration 6 weeks    PT Treatment/Interventions ADLs/Self Care Home Management;Cryotherapy;Electrical Stimulation;Iontophoresis 42m/ml Dexamethasone;Moist Heat;DME Instruction;Gait training;Stair training;Functional mobility training;Therapeutic activities;Therapeutic exercise;Balance training;Neuromuscular re-education;Patient/family education;Manual techniques;Scar mobilization;Passive range of motion;Dry needling;Taping    PT Next Visit Plan Progress L hip ROM and strengthening; gait training; manual therapy to address pain and increased muscle tension, modalities PRN    PT Home Exercise Plan 7/15 - seated hip ABD/ADD, ADD pillow isometric, supine quad & glute sets +/- PPT, heel slides, AAROM hip ABD/ADD; 7/26 - standing 3-way hip SLR/march    Consulted and Agree with Plan of Care Patient           Patient will benefit from skilled therapeutic intervention in order to improve the following deficits and impairments:  Abnormal gait, Decreased activity tolerance, Decreased balance, Decreased endurance, Decreased knowledge of precautions, Decreased knowledge of use of DME, Decreased mobility, Decreased range of motion, Decreased safety awareness, Decreased scar mobility, Decreased strength, Difficulty walking, Increased muscle spasms, Impaired perceived functional ability, Impaired  flexibility, Improper body mechanics, Postural dysfunction, Pain  Visit Diagnosis: Pain in left hip  Stiffness of left hip, not elsewhere classified  Muscle weakness (generalized)  Difficulty in walking, not elsewhere classified  Other abnormalities of gait and mobility     Problem List Patient Active Problem List   Diagnosis Date Noted  . 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 Avascular necrosis of bone of left hip ([facility] 09/19/2019    MBess Harvest PTA 10/30/19 3:15 PM   [place] [address] [place] [address]Phone: [phone]  Fax:  [phone] Name: MAlexxa SabetMRN: [id number] of Birth: 1April 06, 1977

## 2019-11-02 ENCOUNTER — Ambulatory Visit: Payer: Medicaid Other | Admitting: Physical Therapy

## 2019-11-06 ENCOUNTER — Other Ambulatory Visit: Payer: Self-pay

## 2019-11-06 ENCOUNTER — Ambulatory Visit: Payer: Medicaid Other

## 2019-11-06 ENCOUNTER — Telehealth: Payer: Self-pay | Admitting: Radiology

## 2019-11-06 ENCOUNTER — Other Ambulatory Visit: Payer: Self-pay | Admitting: Orthopaedic Surgery

## 2019-11-06 DIAGNOSIS — R2689 Other abnormalities of gait and mobility: Secondary | ICD-10-CM

## 2019-11-06 DIAGNOSIS — M25652 Stiffness of left hip, not elsewhere classified: Secondary | ICD-10-CM

## 2019-11-06 DIAGNOSIS — M25552 Pain in left hip: Secondary | ICD-10-CM | POA: Diagnosis not present

## 2019-11-06 DIAGNOSIS — R262 Difficulty in walking, not elsewhere classified: Secondary | ICD-10-CM

## 2019-11-06 DIAGNOSIS — M6281 Muscle weakness (generalized): Secondary | ICD-10-CM

## 2019-11-06 MED ORDER — OXYCODONE HCL 5 MG PO TABS: 5.0000 mg | ORAL_TABLET | Freq: Four times a day (QID) | ORAL | 0 refills | Status: DC | PRN

## 2019-11-06 NOTE — Telephone Encounter (Signed)
I sent some in to her pharmacy. 

## 2019-11-06 NOTE — Telephone Encounter (Signed)
Received call from practitioner with [facility]. He states that patient came to their office to be seen today complaining of pain in her hip and is out of Oxycodone. He wanted to know if we would be able to refill the medication or if she needs to be seen in the office first.  Per Dr. Magnus Ivan, he will be glad to refill.  Patient to be advised by [facility].  Please send in refill on Oxycodone.

## 2019-11-06 NOTE — Therapy (Addendum)
[facility] [address]  [place], [place], [address] Phone: [phone]   Fax:  [phone]  Physical Therapy Treatment / Discharge Summary  Patient Details  Name: Monica Blanchard MRN: [id number] Date of Birth: 1975-05-27 Referring Provider (PT): Jean Rosenthal, MD   Encounter Date: 11/06/2019   PT End of Session - 11/06/19 1408    Visit Number 6    Number of Visits 13    Date for PT Re-Evaluation 11/23/19    Authorization Type Medicaid [employer]    PT Start Time 1403    PT Stop Time 1447    PT Time Calculation (min) 44 min    Activity Tolerance Patient tolerated treatment well    Behavior During Therapy [facility] for tasks assessed/performed           Past Medical History:  Diagnosis Date  . Anemia   . Arthritis   . Ectopic pregnancy    x 2  . Hypertension     Past Surgical History:  Procedure Laterality Date  . APPENDECTOMY    . CESAREAN SECTION    . ECTOPIC PREGNANCY SURGERY    . TOTAL HIP ARTHROPLASTY Left 10/06/2019   Procedure: LEFT TOTAL HIP ARTHROPLASTY ANTERIOR APPROACH;  Surgeon: Mcarthur Rossetti, MD;  Location: [facility];  Service: Orthopedics;  Laterality: Left;  . WRIST FRACTURE SURGERY      There were no vitals filed for this visit.   Subjective Assessment - 11/06/19 1406    Subjective Pt. reporting, " my pain doesn't feel muscular, its feel internal" in reference to the R hip.    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 Yes    Pain Score 5     Pain Location Hip    Pain Orientation Left;Anterior    Pain Descriptors / Indicators Aching    Pain Type Acute pain;Surgical pain    Pain Radiating Towards "often" travels down L LE "it comes and goes"    Pain Frequency Constant    Multiple Pain Sites No              [facility] PT Assessment - 11/06/19 0001       Assessment   Medical Diagnosis L THA    Referring Provider (PT) Jean Rosenthal, MD    Onset Date/Surgical Date 10/06/19    Hand Dominance Right    Next MD Visit 11/15/19                         [facility] Adult PT Treatment/Exercise - 11/06/19 0001      Knee/Hip Exercises: Stretches   Hip Flexor Stretch Left;2 reps;30 seconds    Hip Flexor Stretch Limitations mod thomas position + strap     Other Knee/Hip Stretches L lunge hip flexor strech 3 x 30 sec       Knee/Hip Exercises: Aerobic   Nustep L4 x 6' (UE/LE)      Knee/Hip Exercises: Standing   Hip Flexion Right;Left;10 reps;Knee bent;Stengthening    Hip Flexion Limitations alternating march     Hip Abduction Right;Left;10 reps;Knee straight    Abduction Limitations Alternating at counter     Hip Extension Right;Left;10 reps;Knee straight    Extension Limitations alternating red looped TB at knees    red TB is band patient has stored in  clinic   Forward Step Up Left;5 reps      Knee/Hip Exercises: Supine   Other Supine Knee/Hip Exercises alternating hip flexor march 2 x 5 reps    Other Supine Knee/Hip Exercises L LE leg lifts from floor to mat table light strap assistance x 10      Knee/Hip Exercises: Sidelying   Clams L clam shell x 10 reps                    PT Short Term Goals - 10/30/19 1459      PT SHORT TERM GOAL #1   Title Patient will be independent with initial HEP    Status Achieved      PT SHORT TERM GOAL #2   Title Patient will demonstrate improved L hip strength to >/= 3+ to 4-/5 for improved stability and ease of mobility    Status Partially Met   08/02: met for L hip flexion, adduction 3+/5 strength with MMT   Target Date 11/02/19             PT Long Term Goals - 10/26/19 1530      PT LONG TERM GOAL #1   Title Patient will be independent with ongoing/advanced HEP    Status On-going    Target Date 11/23/19      PT LONG TERM GOAL #2   Title Patient to improve L hip AROM  to [facility] without pain provocation    Status On-going    Target Date 11/23/19      PT LONG TERM GOAL #3   Title Patient will demonstrate improved L LE strength to >/= 4 to 4+/5 for improved stability and ease of mobility    Status On-going    Target Date 11/23/19      PT LONG TERM GOAL #4   Title Patient will ambulate with normal gait pattern with or w/o SPC or LRAD    Status On-going    Target Date 11/23/19      PT LONG TERM GOAL #5   Title Patient to report ability to perform ADLs, household and work-related tasks without increased L hip pain    Status On-going    Target Date 11/23/19                 Plan - 11/06/19 1410    Clinical Impression Statement Monica Blanchard reporting she has been performing HEP.  Admits to still feeling like she must "pick leg up" due to pain with hip flexion activities.  Progressed hip flexor strengthening and close monitoring for even weight shifts over B with sit<>stand during transfers.  Pt. demonstrating improved weight shifting with gait with SPC and sit>stand after cueing.  Does still demonstrate slow movement pattern with LE clearance activities and with complaint of L anterior hip pain with most therex.  Ended visit with pt. noting LE fatigue and ambulating out of clinic with [facility] and antalgic gait pattern.    Comorbidities OA, AVN L hip, HTN, h/o L wrist fracture    Rehab Potential Excellent    PT Frequency 2x / week    PT Duration 6 weeks    PT Treatment/Interventions ADLs/Self Care Home Management;Cryotherapy;Electrical Stimulation;Iontophoresis 70m/ml Dexamethasone;Moist Heat;DME Instruction;Gait training;Stair training;Functional mobility training;Therapeutic activities;Therapeutic exercise;Balance training;Neuromuscular re-education;Patient/family education;Manual techniques;Scar mobilization;Passive range of motion;Dry needling;Taping    PT Next Visit Plan Progress L hip ROM and strengthening; gait training; manual therapy to address pain and  increased muscle tension, modalities PRN    PT Home  Exercise Plan 7/15 - seated hip ABD/ADD, ADD pillow isometric, supine quad & glute sets +/- PPT, heel slides, AAROM hip ABD/ADD; 7/26 - standing 3-way hip SLR/march    Consulted and Agree with Plan of Care Patient           Patient will benefit from skilled therapeutic intervention in order to improve the following deficits and impairments:  Abnormal gait, Decreased activity tolerance, Decreased balance, Decreased endurance, Decreased knowledge of precautions, Decreased knowledge of use of DME, Decreased mobility, Decreased range of motion, Decreased safety awareness, Decreased scar mobility, Decreased strength, Difficulty walking, Increased muscle spasms, Impaired perceived functional ability, Impaired flexibility, Improper body mechanics, Postural dysfunction, Pain  Visit Diagnosis: Pain in left hip  Stiffness of left hip, not elsewhere classified  Muscle weakness (generalized)  Difficulty in walking, not elsewhere classified  Other abnormalities of gait and mobility     Problem List Patient Active Problem List   Diagnosis Date Noted  . 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 Avascular necrosis of bone of left hip [facility]) 09/19/2019    Bess Harvest, PTA 11/06/19 5:12 PM   [facility] [address]  [place], [place], [address] Phone: [phone]   Fax:  [phone]  Name: Monica Blanchard MRN: [id number] Date of Birth: 25-Jul-1975   PHYSICAL THERAPY DISCHARGE SUMMARY  Visits from Start of Care: 6  Current functional level related to goals / functional outcomes:   Refer to above clinical impression for status as of last visit on 11/06/2019. Patient no showed for her next 3 scheduled appointments, therefore will proceed with discharge from PT for this episode per Cx/NS policy.   Remaining deficits:   As above. Unable to formally assess status  at discharge due to failure to return to PT.   Education / Equipment:   HEP  Plan: Patient agrees to discharge.  Patient goals were partially met. Patient is being discharged due to not returning since the last visit.  ?????     Percival Spanish, PT, MPT 11/27/19, 4:09 PM  [facility] [address]  [place], [place], [address] Phone: [phone]   Fax:  [phone]

## 2019-11-06 NOTE — Telephone Encounter (Signed)
noted 

## 2019-11-09 ENCOUNTER — Ambulatory Visit: Payer: Medicaid Other | Admitting: Physical Therapy

## 2019-11-13 ENCOUNTER — Other Ambulatory Visit: Payer: Self-pay

## 2019-11-13 ENCOUNTER — Ambulatory Visit: Payer: Medicaid Other

## 2019-11-15 ENCOUNTER — Ambulatory Visit: Payer: Medicaid Other | Admitting: Physician Assistant

## 2019-11-16 ENCOUNTER — Ambulatory Visit: Payer: Medicaid Other | Admitting: Physical Therapy

## 2019-11-20 ENCOUNTER — Ambulatory Visit: Payer: Medicaid Other | Admitting: Physical Therapy

## 2019-11-22 ENCOUNTER — Ambulatory Visit (INDEPENDENT_AMBULATORY_CARE_PROVIDER_SITE_OTHER): Payer: Medicaid Other | Admitting: Orthopaedic Surgery

## 2019-11-22 ENCOUNTER — Encounter: Payer: Self-pay | Admitting: Orthopaedic Surgery

## 2019-11-22 DIAGNOSIS — Z96642 Presence of left artificial hip joint: Secondary | ICD-10-CM

## 2019-11-22 NOTE — Progress Notes (Signed)
The patient comes in today at 6-1/2 weeks status post a left total hip arthroplasty.  She is only 44 years old.  She had severe avascular question of of the left hip.  She is starting to have a little bit of right hip pain.  Her right hip x-rays have not shown any worrisome features thus far, however, she understands that about 50% of patients with avascular process in 1 hip likely have it in the other hip.  She is ambulate with a cane.  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  On exam her left operative hip moves smoothly and fluidly and is much improved from her preoperative exam.  Her right hip still moves smoothly and fluidly and has just some mild pain.  At this point we will allow her to return to work on September 20 and this gives her a chance to get stronger and ambulate without an assistive device.  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  I do not need to see her back myself for 6 months with a standing low AP pelvis and lateral of her left operative hip.  If her right hip gets worse, she will give [place] a call because she would need an MRI of the right hip to rule out avascular necrosis.  All questions and concerns were answered and addressed.

## 2019-11-23 ENCOUNTER — Ambulatory Visit: Payer: Medicaid Other | Admitting: Physical Therapy

## 2019-11-27 ENCOUNTER — Ambulatory Visit: Payer: Medicaid Other

## 2019-11-30 ENCOUNTER — Ambulatory Visit: Payer: Medicaid Other | Admitting: Physical Therapy

## 2020-03-15 ENCOUNTER — Telehealth: Payer: Self-pay | Admitting: Orthopaedic Surgery

## 2020-03-15 NOTE — Telephone Encounter (Signed)
Pt called stating she believes she needs an MRI for her R hip as her pain levels continue to elevate; she would like a CB to let her know wether we need to see her in office or if the order could just be put in.  [phone]

## 2020-03-15 NOTE — Telephone Encounter (Signed)
If its for her right hip, we need to see her. I don't see where we have seen her for her right hip; so she will need an xray

## 2020-03-20 ENCOUNTER — Other Ambulatory Visit: Payer: Self-pay

## 2020-03-20 ENCOUNTER — Ambulatory Visit: Payer: Medicaid Other | Admitting: Orthopaedic Surgery

## 2020-03-20 ENCOUNTER — Encounter: Payer: Self-pay | Admitting: Orthopaedic Surgery

## 2020-03-20 ENCOUNTER — Ambulatory Visit (INDEPENDENT_AMBULATORY_CARE_PROVIDER_SITE_OTHER): Payer: Medicaid Other

## 2020-03-20 DIAGNOSIS — M25551 Pain in right hip: Secondary | ICD-10-CM

## 2020-03-20 DIAGNOSIS — Z96642 Presence of left artificial hip joint: Secondary | ICD-10-CM

## 2020-03-20 DIAGNOSIS — M79604 Pain in right leg: Secondary | ICD-10-CM

## 2020-03-20 MED ORDER — TIZANIDINE HCL 4 MG PO TABS
4.0000 mg | ORAL_TABLET | Freq: Three times a day (TID) | ORAL | 1 refills | Status: DC | PRN
Start: 2020-03-20 — End: 2021-10-27

## 2020-03-20 MED ORDER — ACETAMINOPHEN-CODEINE #3 300-30 MG PO TABS: 1.0000 | ORAL_TABLET | Freq: Three times a day (TID) | ORAL | 0 refills | Status: DC | PRN

## 2020-03-20 MED ORDER — METHYLPREDNISOLONE 4 MG PO TABS
ORAL_TABLET | ORAL | 0 refills | Status: DC
Start: 2020-03-20 — End: 2020-05-06

## 2020-03-20 NOTE — Progress Notes (Signed)
Office Visit Note   Patient: Monica Blanchard           Date of Birth: 1975-09-22           MRN: [id number] Visit Date: 03/20/2020              Requested by: Malka So., MD No address on file PCP: Malka So., MD   Assessment & Plan: Visit Diagnoses:  1. Pain in right leg   2. Status post total replacement of left hip   3. Pain in right hip     Plan: Given her right hip pain severity and given the fact that there is a high incidence of avascular necrosis when someone has it in 1 hip, it is prudent to obtain a MRI at this point of her right hip to rule out AVN.  I want to start a 6-day steroid taper as well as a muscle relaxant and pain medication.  I would limit her work in terms of just sedentary work only.  All questions and concerns were answered and addressed.  We will see her back after the MRI of her right hip.  Follow-Up Instructions: Return in about 2 weeks (around 04/03/2020).   Orders:  Orders Placed This Encounter  Procedures  . XR HIP UNILAT W OR W/O PELVIS 1V RIGHT  . XR Lumbar Spine 2-3 Views   Meds ordered this encounter  Medications  . methylPREDNISolone (MEDROL) 4 MG tablet    Sig: Medrol dose pack. Take as instructed    Dispense:  21 tablet    Refill:  0  . tiZANidine (ZANAFLEX) 4 MG tablet    Sig: Take 1 tablet (4 mg total) by mouth every 8 (eight) hours as needed for muscle spasms.    Dispense:  40 tablet    Refill:  1  . acetaminophen-codeine (TYLENOL #3) 300-30 MG tablet    Sig: Take 1-2 tablets by mouth every 8 (eight) hours as needed.    Dispense:  30 tablet    Refill:  0      Procedures: No procedures performed   Clinical Data: No additional findings.   Subjective: Chief Complaint  Patient presents with  . Right Leg - Pain  The patient comes in with a significant low back pain and right hip pain.  We actually replaced her left hip secondary to avascular necrosis in July of this year.  She says this hip still hurts her some on  the left side and in the groin.  Her pain is becoming debilitating for her in terms of her low back and her right hip.  She says twisting activities at work causes significant pain.  She also is describing some type of sciatica bilaterally as well.  HPI  Review of Systems She currently denies any headache, chest pain, shortness of breath, fever, chills, nausea, vomiting  Objective: Vital Signs: There were no vitals taken for this visit.  Physical Exam She is alert and oriented x3 and in no acute distress Ortho Exam Examination of her hips show the move smoothly on the left side but does have pain on the right side with internal and external rotation.  She has pain in her low back.  She does have groin pain on her left operative side. Specialty Comments:  No specialty comments available.  Imaging: XR HIP UNILAT W OR W/O PELVIS 1V RIGHT  Result Date: 03/20/2020 An AP pelvis lateral the right hip shows a total hip arthroplasty on the left  side with no complicating features.  There is some slight cystic changes in the femoral head that are barely visible on the right side and an overhanging osteophyte laterally.  The patient does have a history of significant AVN with her left hip prior to hip replacement surgery.  XR Lumbar Spine 2-3 Views  Result Date: 03/20/2020 2 views lumbar spine show no acute findings with normal alignment.    PMFS History: Patient Active Problem List   Diagnosis Date Noted  . 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 Avascular necrosis of bone of left hip (HCC) 09/19/2019   Past Medical History:  Diagnosis Date  . Anemia   . Arthritis   . Ectopic pregnancy    x 2  . Hypertension     Family History  Problem Relation Age of Onset  . Diabetes Other   . Hypertension Other     Past Surgical History:  Procedure Laterality Date  . APPENDECTOMY    . CESAREAN SECTION    . ECTOPIC PREGNANCY SURGERY    . TOTAL HIP ARTHROPLASTY Left  10/06/2019   Procedure: LEFT TOTAL HIP ARTHROPLASTY ANTERIOR APPROACH;  Surgeon: Kathryne Hitch, MD;  Location: [facility];  Service: Orthopedics;  Laterality: Left;  . WRIST FRACTURE SURGERY     Social History   Occupational History  . Not on file  Tobacco Use  . Smoking status: Current Every Day Smoker    Packs/day: 0.50    Years: 15.00    Pack years: 7.50    Types: Cigarettes  . Smokeless tobacco: Never Used  Vaping Use  . Vaping Use: Never used  Substance and Sexual Activity  . Alcohol use: Yes    Comment: occ  . Drug use: Never  . Sexual activity: Not Currently

## 2020-04-03 ENCOUNTER — Ambulatory Visit: Payer: Medicaid Other | Admitting: Orthopaedic Surgery

## 2020-04-08 ENCOUNTER — Other Ambulatory Visit: Payer: Self-pay | Admitting: Physician Assistant

## 2020-04-08 ENCOUNTER — Telehealth: Payer: Self-pay | Admitting: Orthopaedic Surgery

## 2020-04-08 MED ORDER — ACETAMINOPHEN-CODEINE #3 300-30 MG PO TABS: 1.0000 | ORAL_TABLET | Freq: Three times a day (TID) | ORAL | 0 refills | Status: AC | PRN

## 2020-04-08 NOTE — Telephone Encounter (Signed)
Please advise 

## 2020-04-08 NOTE — Telephone Encounter (Signed)
Patient called. She would like a refill on Tylenol #3

## 2020-04-08 NOTE — Telephone Encounter (Signed)
Done

## 2020-04-17 ENCOUNTER — Other Ambulatory Visit: Payer: Self-pay

## 2020-04-17 ENCOUNTER — Ambulatory Visit
Admission: RE | Admit: 2020-04-17 | Discharge: 2020-04-17 | Disposition: A | Discharge status: Home / Self Care | Payer: Medicaid Other | Source: Ambulatory Visit | Attending: Orthopaedic Surgery | Admitting: Orthopaedic Surgery

## 2020-04-17 DIAGNOSIS — M25551 Pain in right hip: Secondary | ICD-10-CM

## 2020-04-22 ENCOUNTER — Ambulatory Visit: Payer: Self-pay

## 2020-04-22 ENCOUNTER — Encounter: Payer: Self-pay | Admitting: Orthopaedic Surgery

## 2020-04-22 ENCOUNTER — Ambulatory Visit (INDEPENDENT_AMBULATORY_CARE_PROVIDER_SITE_OTHER): Payer: Medicaid Other | Admitting: Orthopaedic Surgery

## 2020-04-22 ENCOUNTER — Other Ambulatory Visit: Payer: Self-pay

## 2020-04-22 DIAGNOSIS — M25551 Pain in right hip: Secondary | ICD-10-CM

## 2020-04-22 NOTE — Progress Notes (Signed)
The patient is here to go over a MRI of her right hip.  Actually replaced her left hip and she is only 45 years old.  That hip was all full in terms of the arthritis and osteonecrosis.  With her right hip hurting in the groin it was appropriate to obtain the MRI of the right hip with plain films appearing more normal.  She still hurts with weightbearing especially when she is been working on her feet all day long with pain to go on her right hip.  The MRI of her right hip only shows some mild arthritis with no cartilage defect at all.  There is some degenerative labral tearing but no flap tear.  I went over the MRI findings with her.  This is not a hip the need for replacing but I do feel that she would benefit from a one-time intra-articular steroid injection in the right hip.  She agrees with this treatment plan.  I would be happy to keep her out of work the next 2 weeks if Dr. Prince Rome is able to place an injection in her right hip today I would then see her back in 2 weeks for repeat exam and hopefully then allow her to get back to work.  I have also advocated hip strengthening exercises and weight loss.  She agrees with this treatment plan.

## 2020-04-22 NOTE — Progress Notes (Signed)
Subjective: Patient is here for ultrasound-guided intra-articular right hip injection.   Pain from DJD.  Objective:  Pain with IR, and pain over GT.  Procedure: Ultrasound guided injection is preferred based studies that show increased duration, increased effect, greater accuracy, decreased procedural pain, increased response rate, and decreased cost with ultrasound guided versus blind injection.   Verbal informed consent obtained.  Time-out conducted.  Noted no overlying erythema, induration, or other signs of local infection. Ultrasound-guided right hip injection: After sterile prep with Betadine, injected 4 cc 0.25% bupivacaine without epinephrine and 6 mg betamethasone using a 22-gauge spinal needle, passing the needle through the iliofemoral ligament into the femoral head/neck junction.  Injectate seen filling joint capsule.  Good immediate relief.

## 2020-05-06 ENCOUNTER — Ambulatory Visit (INDEPENDENT_AMBULATORY_CARE_PROVIDER_SITE_OTHER): Payer: Medicaid Other | Admitting: Orthopaedic Surgery

## 2020-05-06 ENCOUNTER — Encounter: Payer: Self-pay | Admitting: Orthopaedic Surgery

## 2020-05-06 DIAGNOSIS — M25551 Pain in right hip: Secondary | ICD-10-CM | POA: Diagnosis not present

## 2020-05-06 NOTE — Progress Notes (Signed)
HPI: [name] returns today for follow-up of her right hip status post intra-articular injection.  She states that the hip pain is improved.  She is having no groin pain no lateral pain.  She is having some pain in the right buttocks region.  Denies any radicular symptoms down the leg.  She is wanting to return to work full duties on 05/13/2020. MRI did show mild arthritis in the right hip but no evidence of AVN. Review of systems see HPI otherwise negative  Physical exam: Right hip excellent range of motion without pain.  She is nontender over the trochanteric region. Ambulates without any assistive devices and a normal nonantalgic gait.  Impression: Mild right hip arthritis  Plan: She is given a note to return to work full duties on 05/13/2020 without restriction.  She will follow-up with [place] as needed.  Questions encouraged and answered.  I did show her piriformis stretching exercises that she can do on her own.

## 2020-05-27 ENCOUNTER — Ambulatory Visit: Payer: Medicaid Other | Admitting: Orthopaedic Surgery

## 2021-05-15 IMAGING — CR DG HIP (WITH OR WITHOUT PELVIS) 2-3V*L*
3 series · 3 of 3 positions shown · non-contrast
Comparison: CT 03/14/2015, radiograph 08/25/2019

CLINICAL DATA: Hip pain, felt a pop

EXAM:
DG HIP (WITH OR WITHOUT PELVIS) 2-3V LEFT

[t pelvis a.p.]
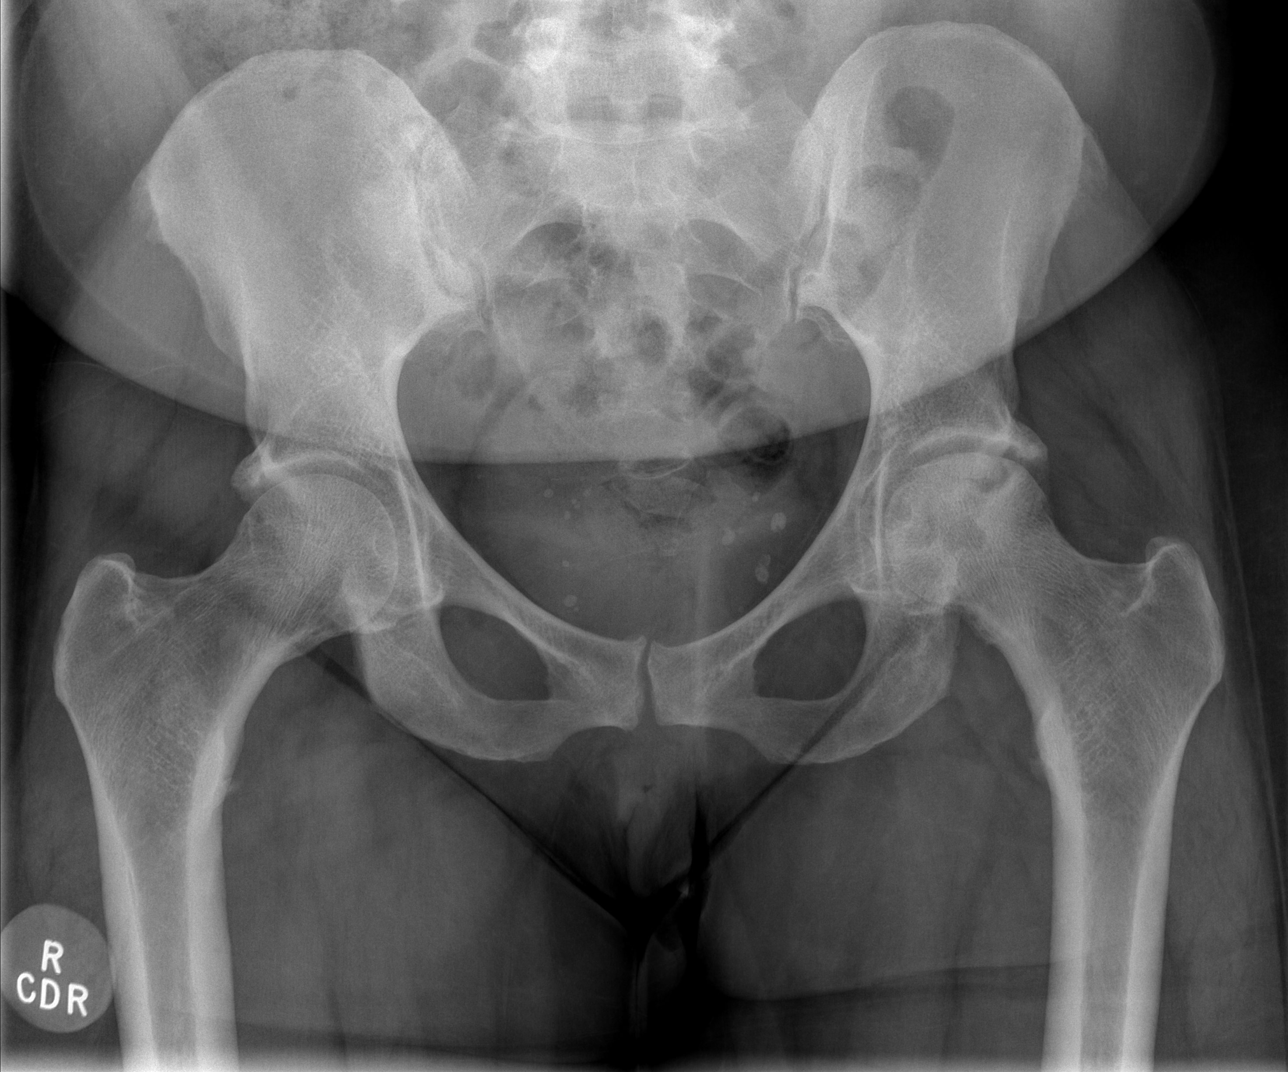

[t hip ap left]
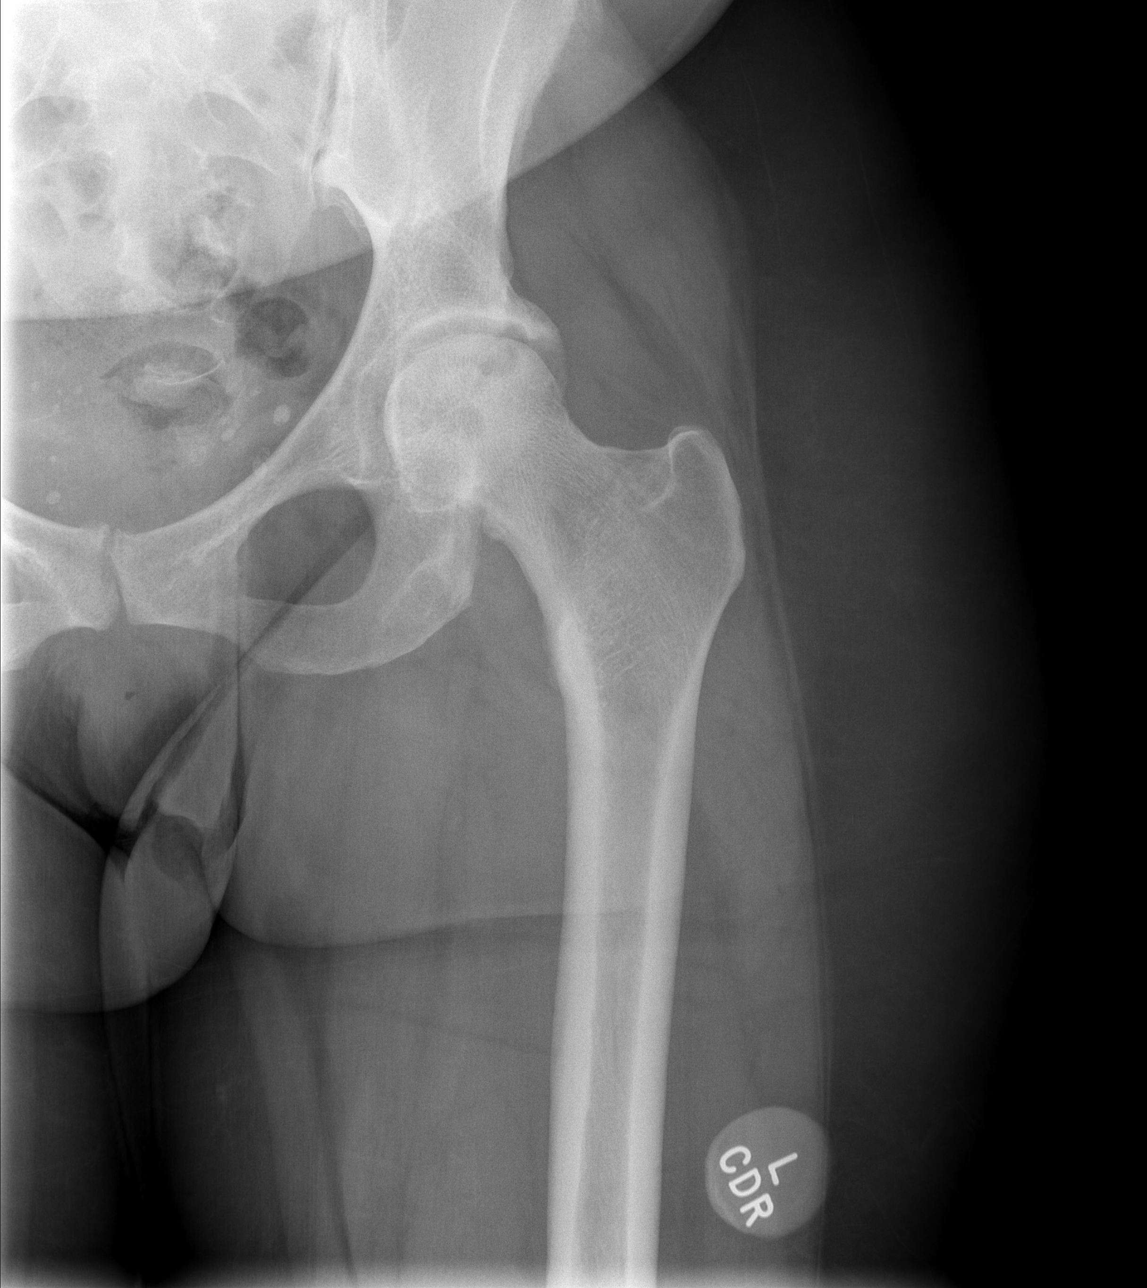

[t hip frog leg left]
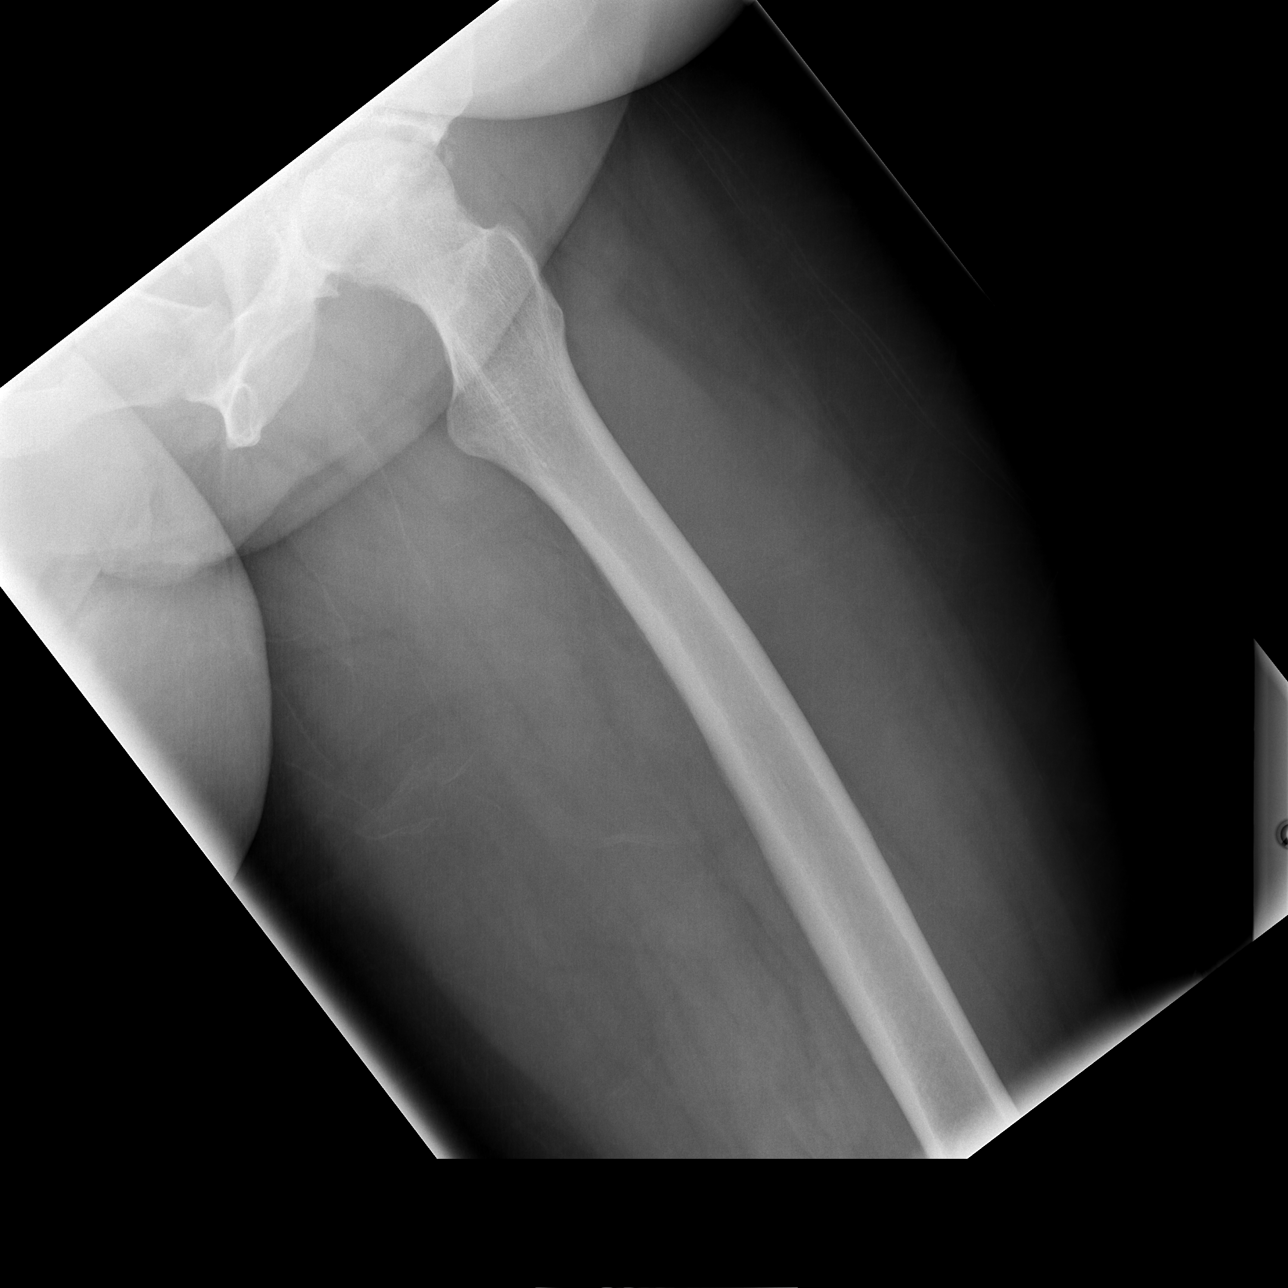

[3 of 3 positions shown; findings below may reference images not displayed]

FINDINGS: Serpentine sclerosis and increasing lucency of the left femoral head
are compatible with an osteochondral lesion including a possible
osteochondral fracture fragment of the superior femoral head there
is associated bony remodeling of the left acetabulum and mild
thickening about the left hip joint which could reflect some
effusion and or synovitis. These features are somewhat progressive
from the comparison study. Mild-to-moderate degenerative changes are
present in the right hip. Bones of the pelvis are otherwise intact
and congruent. Phleboliths in the pelvis. Remaining soft tissues are
unremarkable.
IMPRESSION: 1. Osteochondral lesion of the left femoral head with associated
bony remodeling of the acetabulum and possible osteochondral
fracture fragment of the superior femoral head. Features are
progressive from the comparison study. Progressive bony remodeling
of the left acetabulum and soft tissue thickening of the left hip,
could suggest an effusion. Could consider further evaluation with
MRI or CT.
2. Mild-to-moderate degenerative changes in the right hip.

## 2021-05-15 IMAGING — CT CT HIP*L* W/O CM
2 of 3 series · 16 of 46 positions shown, 18 images · non-contrast
Comparison: None.
COMPARISON: None.

Addendum:
CLINICAL DATA: Evaluation of chronic hip pain.

EXAM:
CT OF THE LEFT HIP WITHOUT CONTRAST
TECHNIQUE: Multidetector CT imaging of the left hip was performed according to
the standard protocol. Multiplanar CT image reconstructions were
also generated.

[Series 5: coronal st · coronal · 0.46mm/px · 3 of 106 slices shown]
[im 36/106  soft-tissue]
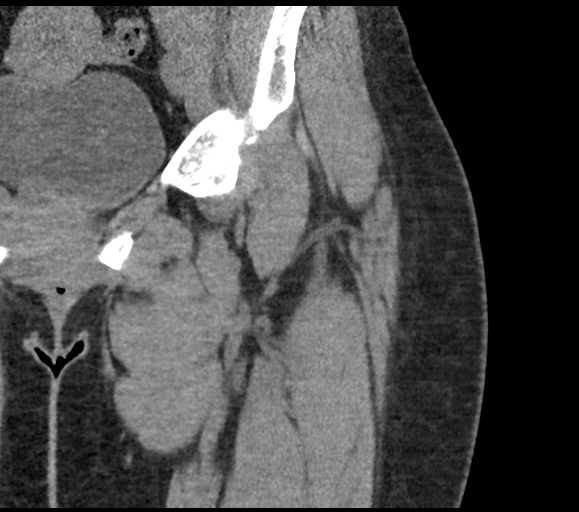
[im 47/106  soft-tissue]
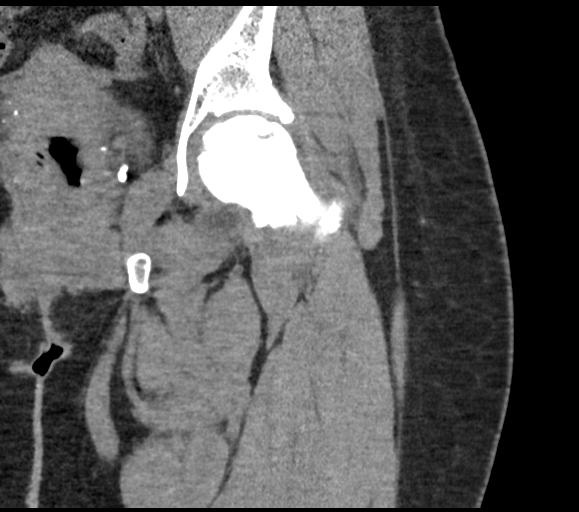
[im 59/106  soft-tissue]
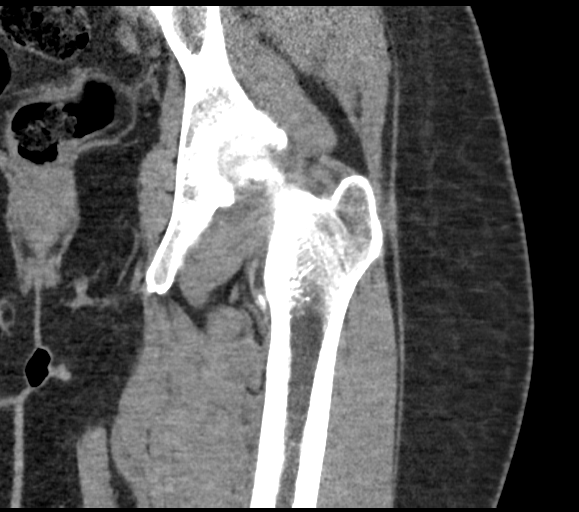

[Series 9: axial soft tissue · axial · 0.43mm/px · z∈[-385,-183]mm · 13 of 117 slices shown, 15 images]
[im 8/117  soft-tissue]
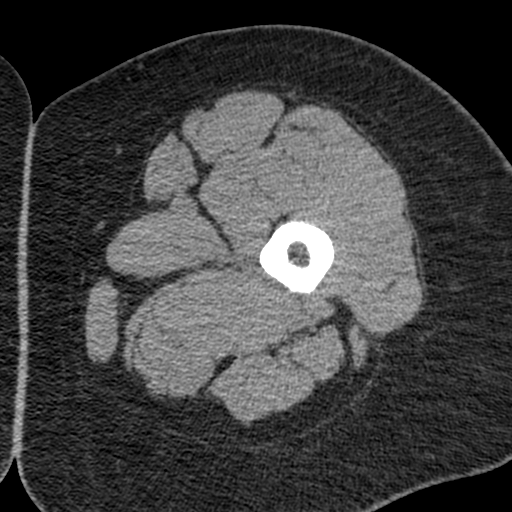
[im 8/117  bone]
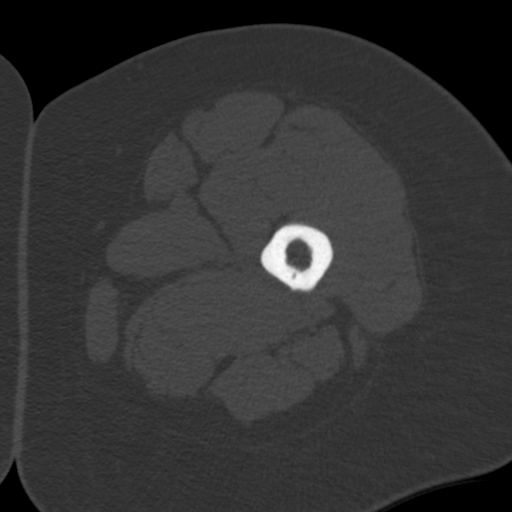
[im 15/117  soft-tissue]
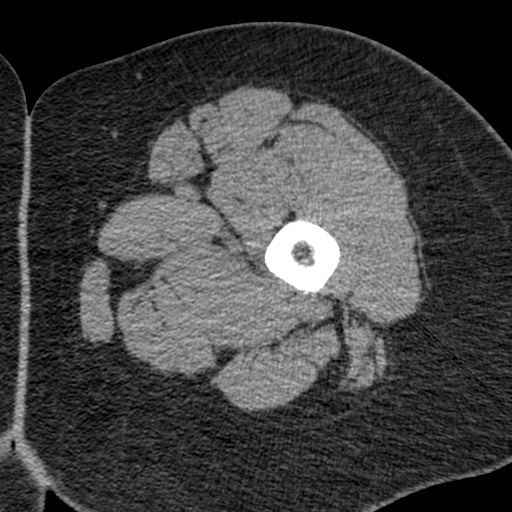
[im 23/117  soft-tissue]
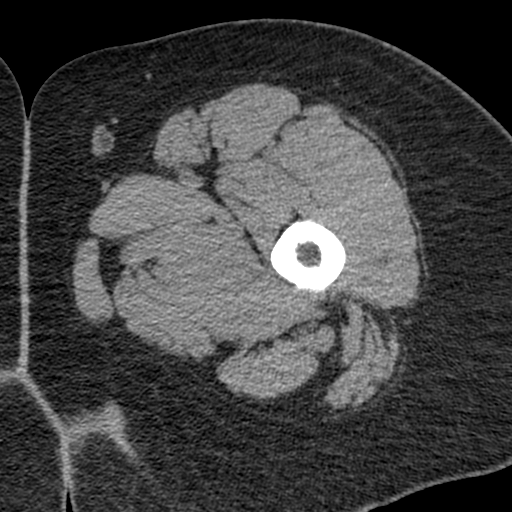
[im 34/117  soft-tissue]
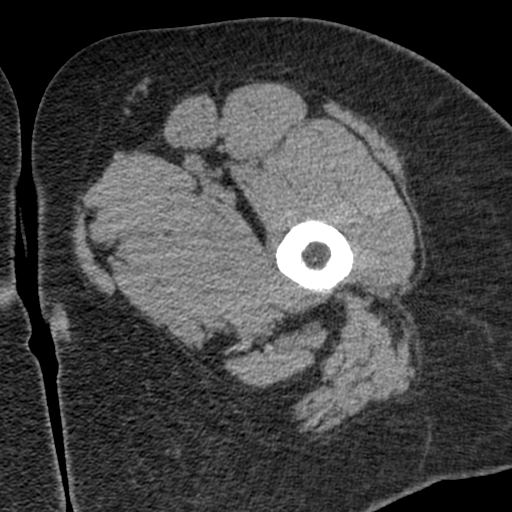
[im 42/117  soft-tissue]
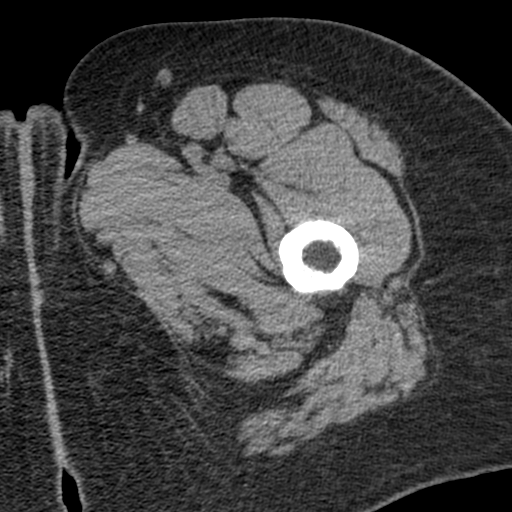
[im 49/117  soft-tissue]
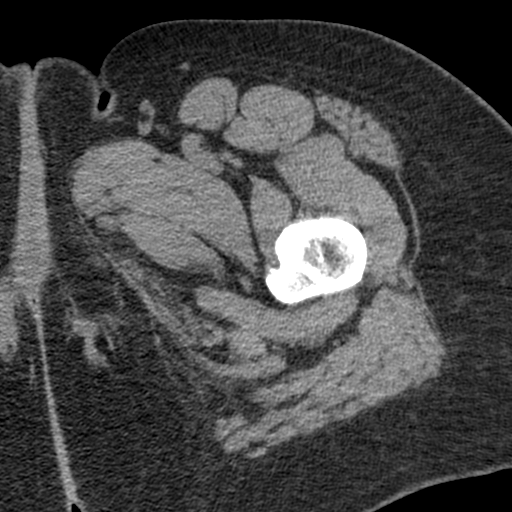
[im 60/117  soft-tissue]
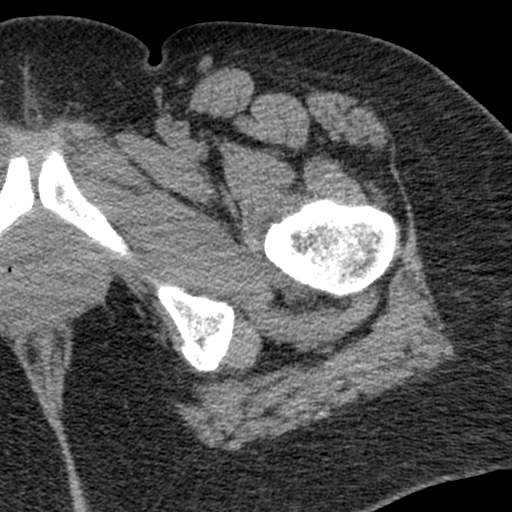
[im 68/117  soft-tissue]
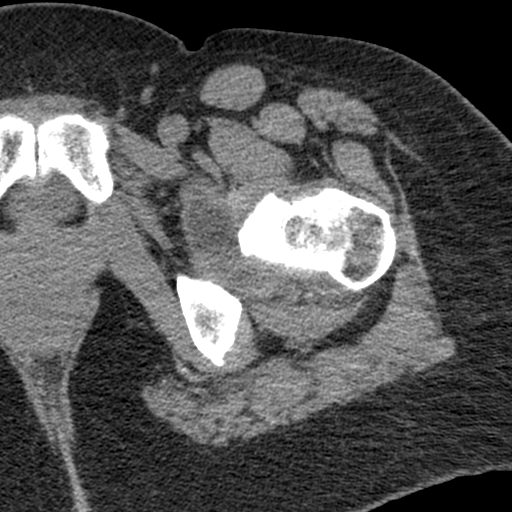
[im 75/117  soft-tissue]
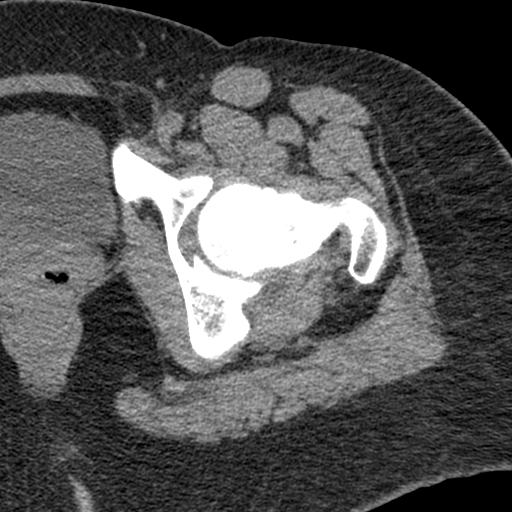
[im 75/117  bone]
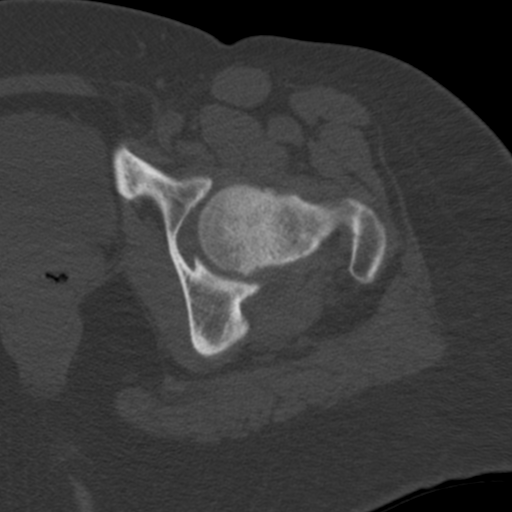
[im 83/117  soft-tissue]
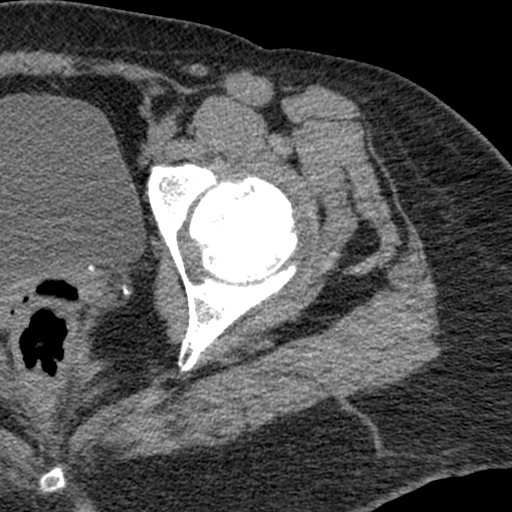
[im 94/117  soft-tissue]
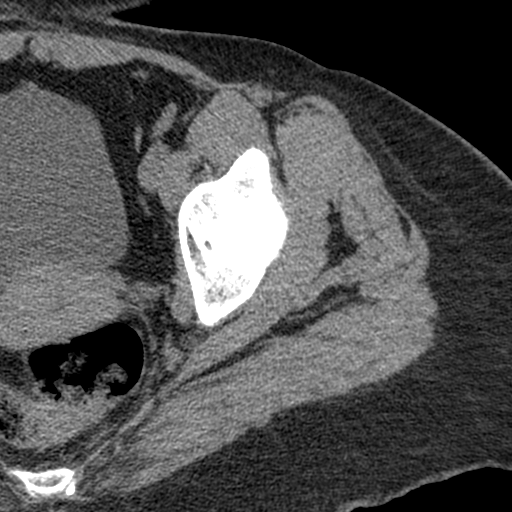
[im 102/117  soft-tissue]
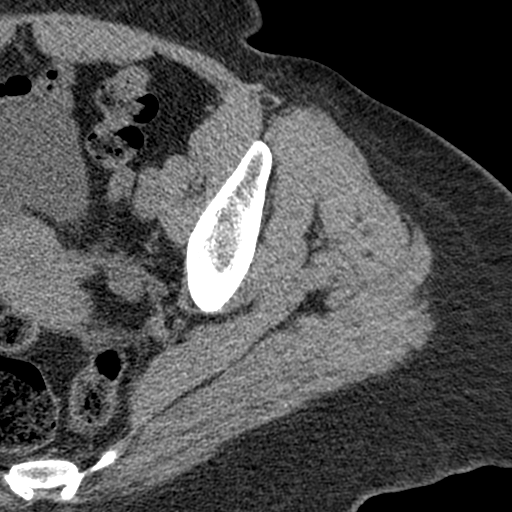
[im 109/117  soft-tissue]
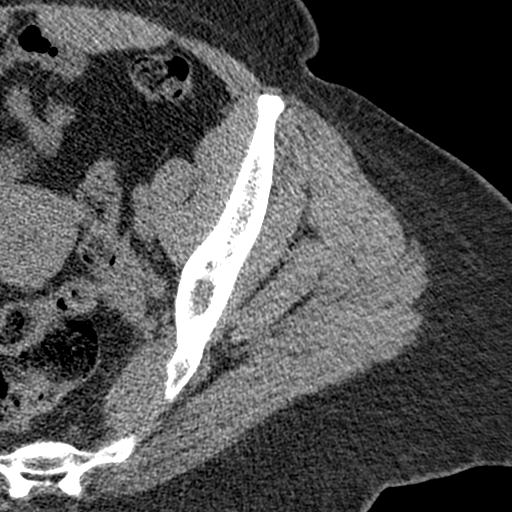

[16 of 46 positions shown; findings below may reference images not displayed]

FINDINGS: Bones/Joint/Cartilage

A mildly displaced fracture deformity is seen involving the left
humeral head. Adjacent 1.0 cm 1.3 cm subcortical cysts are seen
within this region. There is no evidence of dislocation.

Mild to moderate severity degenerative changes are noted along the
lateral aspect of the left acetabulum.

Ligaments

Suboptimally assessed by CT.

Muscles and Tendons

The muscles and tendons are intact.

Soft tissues

Soft tissue structures are normal in appearance without evidence of
significant soft tissue swelling or hematoma.
IMPRESSION: 1. Mildly displaced fracture deformity involving the left humeral
head. MRI correlation is recommended.
2. Mild to moderate severity degenerative changes along the lateral
aspect of the left acetabulum.

ADDENDUM:
Bones/Joint/Cartilage

A mildly displaced fracture deformity is seen involving the left
femoral head.
IMPRESSION: 1. Mildly displaced fracture deformity involving the left femoral
head. MRI correlation is recommended.

*** End of Addendum ***
FINDINGS: Bones/Joint/Cartilage

A mildly displaced fracture deformity is seen involving the left
humeral head. Adjacent 1.0 cm 1.3 cm subcortical cysts are seen
within this region. There is no evidence of dislocation.

Mild to moderate severity degenerative changes are noted along the
lateral aspect of the left acetabulum.

Ligaments

Suboptimally assessed by CT.

Muscles and Tendons

The muscles and tendons are intact.

Soft tissues

Soft tissue structures are normal in appearance without evidence of
significant soft tissue swelling or hematoma.
IMPRESSION: 1. Mildly displaced fracture deformity involving the left humeral
head. MRI correlation is recommended.
2. Mild to moderate severity degenerative changes along the lateral
aspect of the left acetabulum.

## 2021-06-05 IMAGING — RF DG HIP (WITH PELVIS) OPERATIVE*L*
1 series · 3 of 3 positions shown · non-contrast
Comparison: None.

CLINICAL DATA: Status post left hip replacement.

FLUOROSCOPY TIME:  21 seconds
EXAM:
OPERATIVE LEFT HIP (WITH PELVIS IF PERFORMED) 3 VIEWS
TECHNIQUE: Fluoroscopic spot image(s) were submitted for interpretation
post-operatively.

[Series 1: unknown protocol · 0.20mm/px · 3 of 3 slices shown]
[im 1/3]
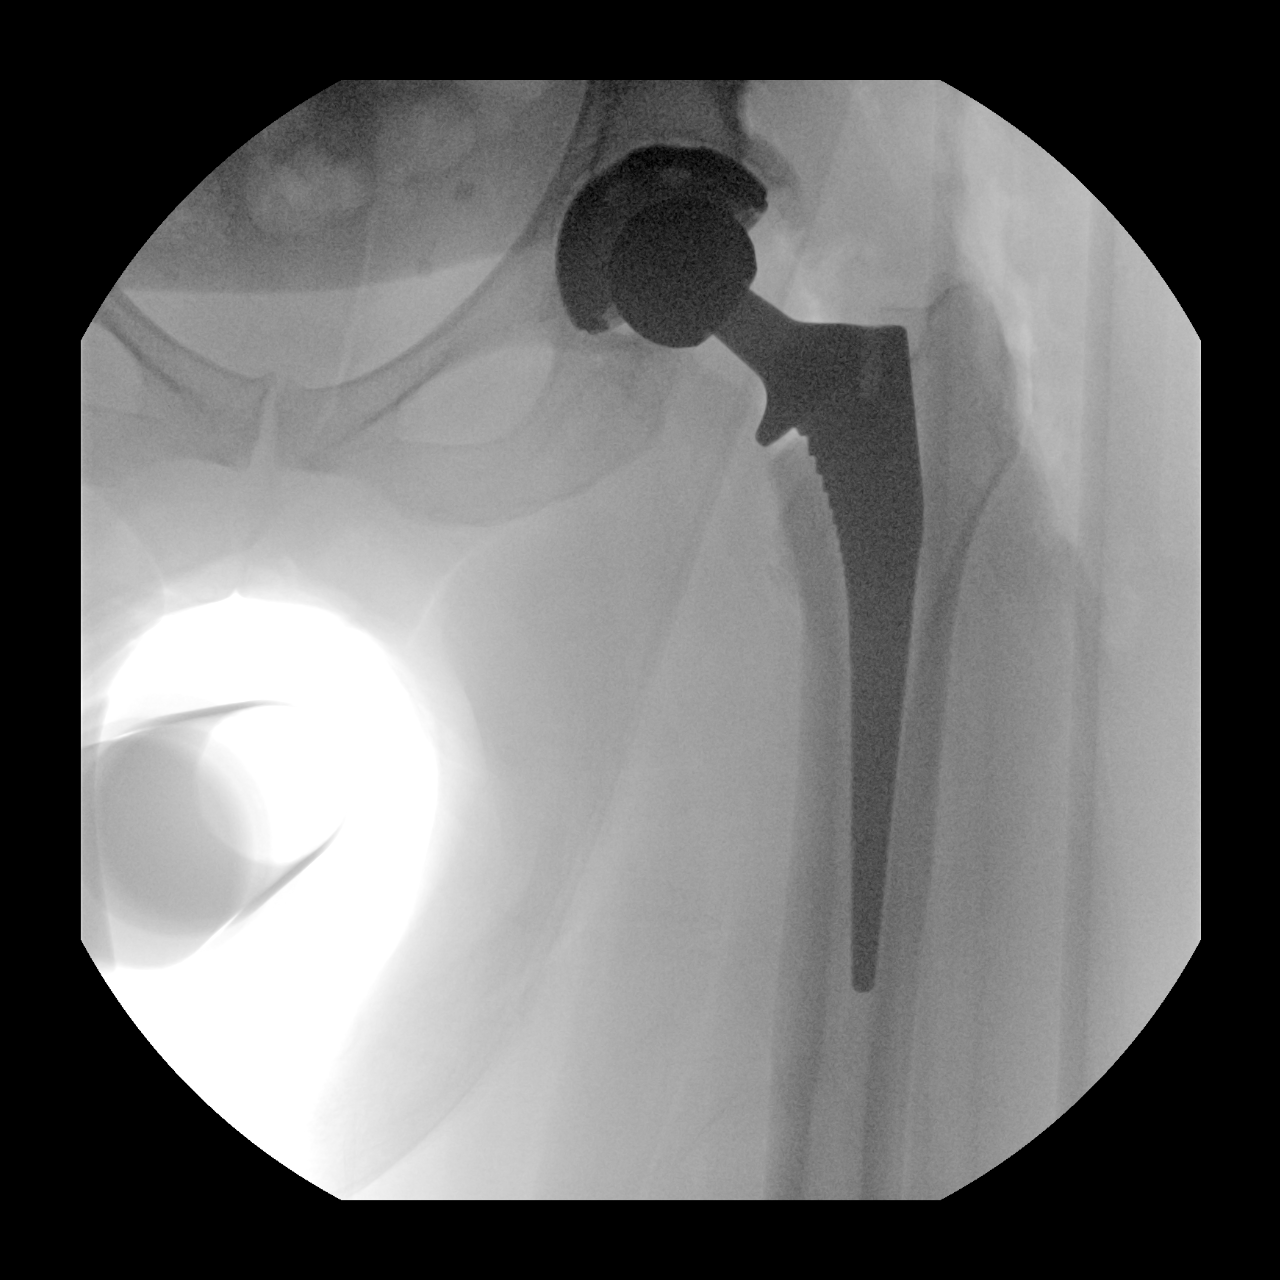
[im 2/3]
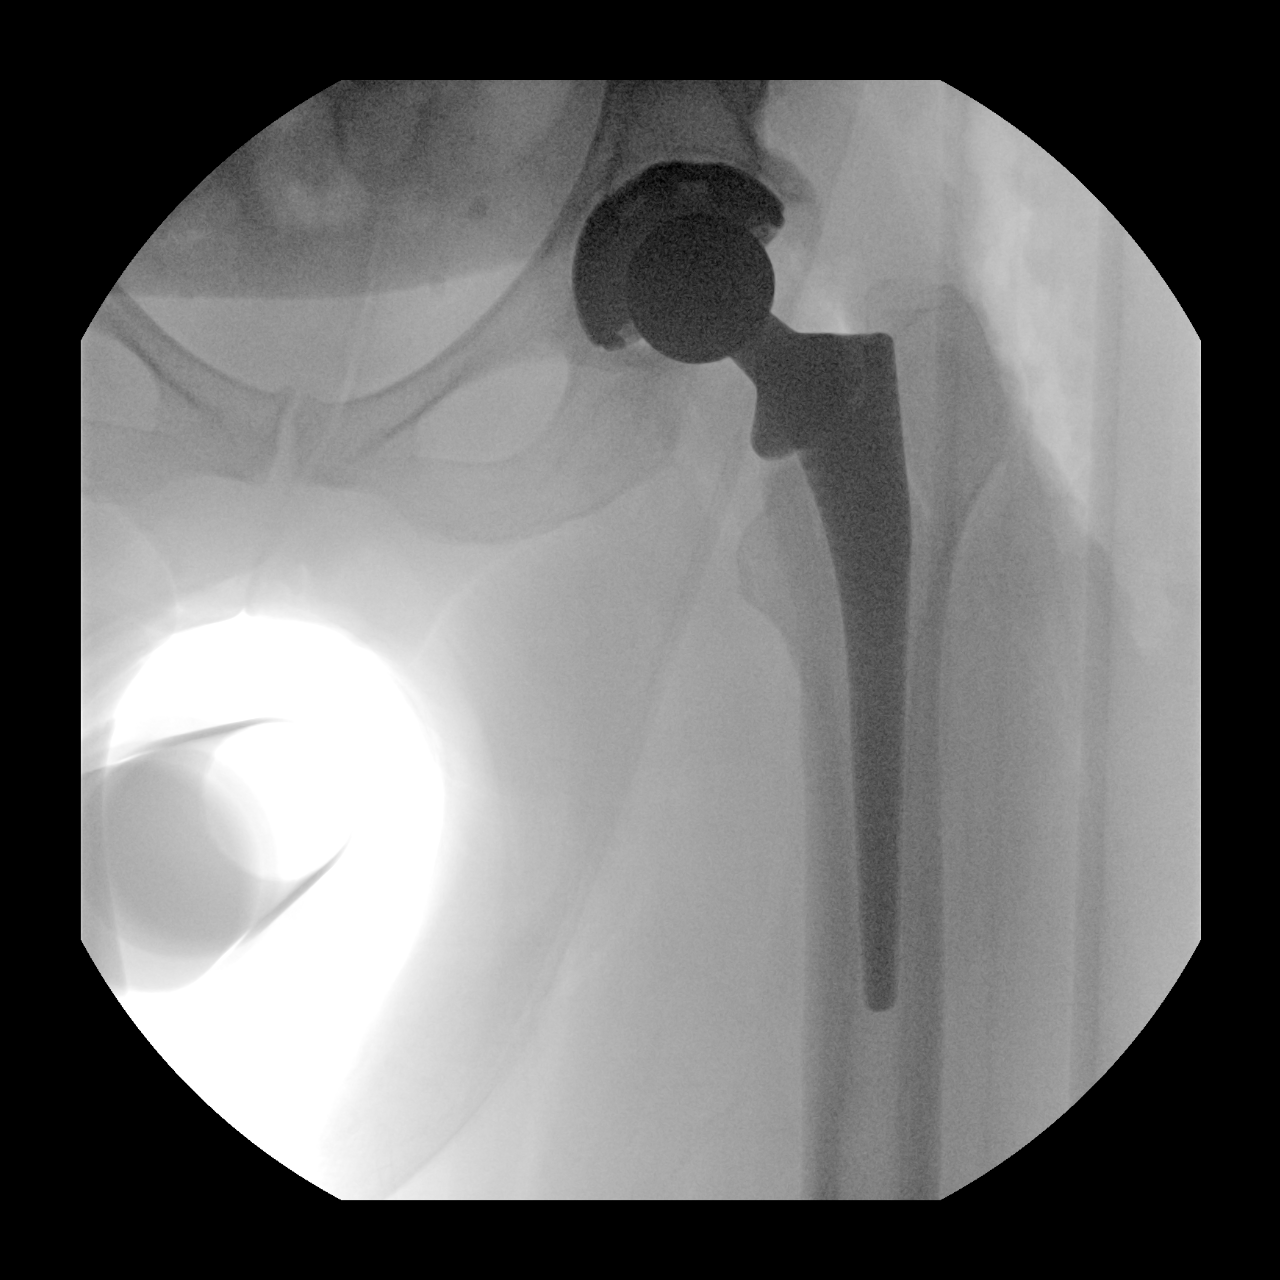
[im 3/3]
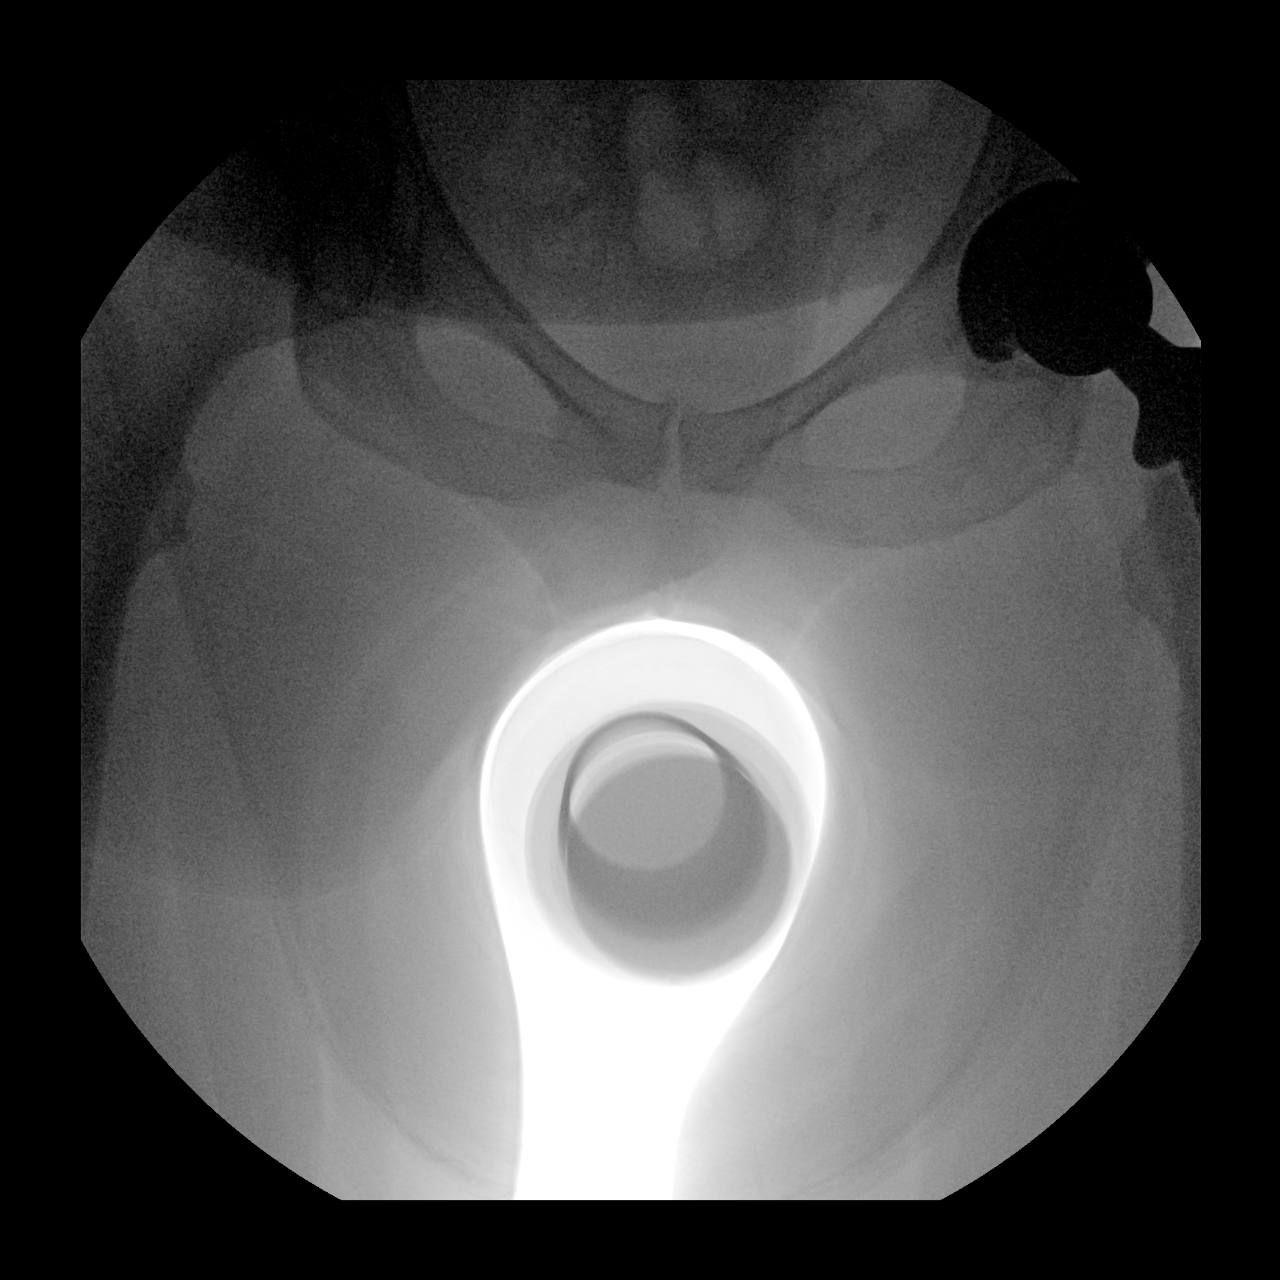

[3 of 3 positions shown; findings below may reference images not displayed]

FINDINGS: Fluoroscopic images were obtained during left hip replacement.
Acetabular and femoral components are in good position. No other
acute abnormalities.
IMPRESSION: Left hip replacement as above.

## 2021-06-05 IMAGING — DX DG PORTABLE PELVIS
1 series · 1 of 1 positions shown · non-contrast
Comparison: None.

CLINICAL DATA: Status post left hip replacement.

EXAM:
PORTABLE PELVIS 1-2 VIEWS

[pelvis ap]
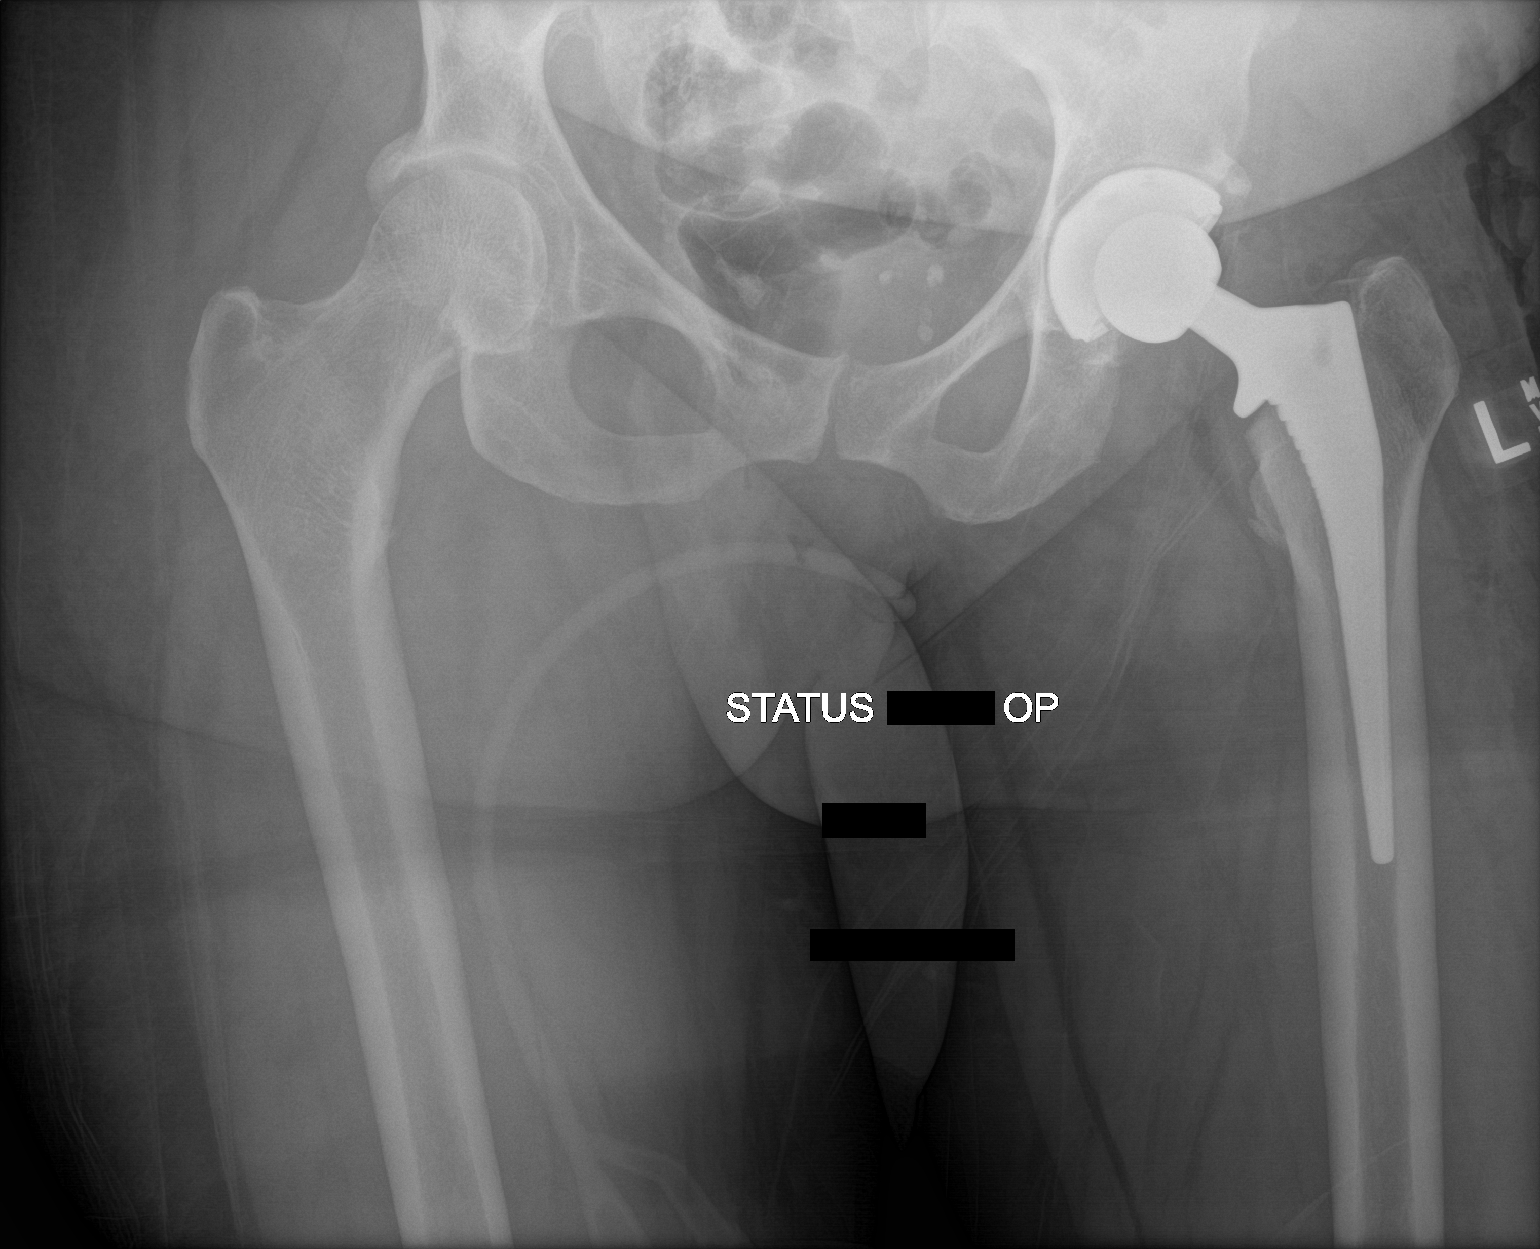

[1 of 1 positions shown; findings below may reference images not displayed]

FINDINGS: The patient is status post left hip replacement. Acetabular and
femoral components are in good position. No other acute
abnormalities.
IMPRESSION: Left hip replacement as above.

## 2021-10-27 ENCOUNTER — Ambulatory Visit: Payer: Self-pay

## 2021-10-27 ENCOUNTER — Ambulatory Visit: Payer: Medicaid Other | Admitting: Physician Assistant

## 2021-10-27 ENCOUNTER — Encounter: Payer: Self-pay | Admitting: Physician Assistant

## 2021-10-27 VITALS — Ht 68.0 in | Wt 223.6 lb

## 2021-10-27 DIAGNOSIS — M25551 Pain in right hip: Secondary | ICD-10-CM | POA: Diagnosis not present

## 2021-10-27 MED ORDER — MELOXICAM 7.5 MG PO TABS: 7.5000 mg | ORAL_TABLET | Freq: Two times a day (BID) | ORAL | 1 refills | Status: AC

## 2021-10-27 NOTE — Progress Notes (Signed)
Office Visit Note   Patient: Monica Blanchard           Date of Birth: Jan 04, 1976           MRN: [id number] Visit Date: 10/27/2021              Requested by: Malka So., MD [address] [place] [address]ure[address] [place],  [place] [address] PCP: Malka So., MD   Assessment & Plan: Visit Diagnoses:  1. Pain in right hip     Plan: Currently she is not interested in an intra-articular injection of the right hip: This is beneficial in the past.  She is also not interested in surgical intervention right hip.  We will place her on Mobic 7.5 mg twice daily see if this helps with her hip pain.  She will follow-up with Monica Blanchard in 1 month to see how she is doing overall.  Follow-Up Instructions: Return in about 1 month (around 11/27/2021).   Orders:  No orders of the defined types were placed in this encounter.  Meds ordered this encounter  Medications   meloxicam (MOBIC) 7.5 MG tablet    Sig: Take 1 tablet (7.5 mg total) by mouth 2 (two) times daily.    Dispense:  60 tablet    Refill:  1      Procedures: No procedures performed   Clinical Data: No additional findings.   Subjective: Chief Complaint  Patient presents with   Right Hip - Pain    HPI Monica Blanchard 46 year old female well-known to [employer].  She was seen in 2022 and was sent for right hip injection she was seen after the injection and got good relief with the injection.  She feels that she got 2 months of at least 70% improvement right hip after the injection.  MRI showed only mild arthritis in her hip.  Recently had radiographs of the right hip performed at [facility] there is radiologist read is available actual films not available films were read as mild hip arthritis no acute findings.  She has been having right groin pain that she describes as 10 out of 10 pain at worst.  She is having trouble and had to use a walker due to the pain.  She has had no injury to the hip.  Again she was seen in the [facility] ER  on 10/14/2021 where x-rays were performed and she was also given a Toradol shot given prednisone thought Dosepak which made her pain 90% better.  She denies any numbness tingling down either leg.  She does note she works 12-hour shifts.  Currently the right hip pains been ongoing for the past 6 months but is gotten progressively worse.  History of left total hip arthroplasty occasionally has some left hip pain.  Denies any back pain.  Review of Systems Denies any fevers or chills.  Objective: Vital Signs: Ht 5\' 8"  (1.727 m)   Wt 223 lb 9.6 oz (101.4 kg)   BMI 34.00 kg/m   Physical Exam Constitutional:      Appearance: She is not ill-appearing or diaphoretic.  Pulmonary:     Effort: Pulmonary effort is normal.  Neurological:     Mental Status: She is alert and oriented to person, place, and time.     Ortho Exam Left hip full range of motion without pain.  Right hip fluid motion and pain with extremes of internal/external rotation.  No tenderness over the trochanteric region of either hip.  Ambulates without any  assistive device. Specialty Comments:  No specialty comments available.  Imaging: No results found.   PMFS History: Patient Active Problem List   Diagnosis Date Noted   Status post total replacement of left hip 10/06/2019   Avascular necrosis of bone of left hip (HCC) 09/19/2019   Past Medical History:  Diagnosis Date   Anemia    Arthritis    Ectopic pregnancy    x 2   Hypertension     Family History  Problem Relation Age of Onset   Diabetes Other    Hypertension Other     Past Surgical History:  Procedure Laterality Date   APPENDECTOMY     CESAREAN SECTION     ECTOPIC PREGNANCY SURGERY     TOTAL HIP ARTHROPLASTY Left 10/06/2019   Procedure: LEFT TOTAL HIP ARTHROPLASTY ANTERIOR APPROACH;  Surgeon: Kathryne Hitch, MD;  Location: [facility];  Service: Orthopedics;  Laterality: Left;   WRIST FRACTURE SURGERY     Social History   Occupational History    Not on file  Tobacco Use   Smoking status: Every Day    Packs/day: 0.50    Years: 15.00    Total pack years: 7.50    Types: Cigarettes   Smokeless tobacco: Never  Vaping Use   Vaping Use: Never used  Substance and Sexual Activity   Alcohol use: Yes    Comment: occ   Drug use: Never   Sexual activity: Not Currently

## 2021-11-24 ENCOUNTER — Ambulatory Visit: Payer: Medicaid Other | Admitting: Orthopaedic Surgery

## 2022-06-04 ENCOUNTER — Encounter: Payer: Self-pay | Admitting: Radiology
